# Patient Record
Sex: Female | Born: 1985 | Hispanic: No | Marital: Married | Smoking: Never smoker
Health system: Southern US, Community
[De-identification: ages and names within clinical notes are randomized; demographics above are authoritative.]

## PROBLEM LIST (undated history)

## (undated) DIAGNOSIS — M545 Low back pain: Secondary | ICD-10-CM

## (undated) DIAGNOSIS — D241 Benign neoplasm of right breast: Secondary | ICD-10-CM

## (undated) HISTORY — PX: DILATION AND EVACUATION: SHX1459

---

## 2011-01-23 ENCOUNTER — Ambulatory Visit: Payer: Self-pay | Admitting: Family Medicine

## 2011-01-27 ENCOUNTER — Encounter: Payer: Self-pay | Admitting: Family Medicine

## 2011-01-27 ENCOUNTER — Other Ambulatory Visit: Payer: Self-pay | Admitting: Family Medicine

## 2011-01-27 ENCOUNTER — Ambulatory Visit (INDEPENDENT_AMBULATORY_CARE_PROVIDER_SITE_OTHER): Payer: 59 | Admitting: Family Medicine

## 2011-01-27 VITALS — BP 97/71 | HR 68 | Ht 63.0 in | Wt 102.0 lb

## 2011-01-27 DIAGNOSIS — Z136 Encounter for screening for cardiovascular disorders: Secondary | ICD-10-CM

## 2011-01-27 DIAGNOSIS — R0789 Other chest pain: Secondary | ICD-10-CM

## 2011-01-27 NOTE — Progress Notes (Signed)
  Subjective:    Patient ID: Brittany Walker, female    DOB: 1986/03/17, 25 y.o.   MRN: [id number]  HPI Had some CP for about 15-20 minutes. HAs had several episodes, happened tiwce. One with activity and one in her sleep.  No GERD sxs. Symptoms mid-sternal. No family hx of heart problems.  HEart beats fast with the pain. Has been very stressed.  Having relationship problems. No other GI sxs or dysphagia.      Review of Systems  Constitutional: Negative for fever, diaphoresis and unexpected weight change.  HENT: Negative for hearing loss, rhinorrhea and tinnitus.   Eyes: Negative for visual disturbance.  Respiratory: Negative for cough and wheezing.   Cardiovascular: Negative for chest pain and palpitations.  Gastrointestinal: Negative for nausea, vomiting, diarrhea and blood in stool.  Genitourinary: Negative for vaginal bleeding, vaginal discharge and difficulty urinating.  Musculoskeletal: Negative for myalgias and arthralgias.  Skin: Negative for rash.  Neurological: Negative for headaches.  Hematological: Negative for adenopathy. Does not bruise/bleed easily.  Psychiatric/Behavioral: Negative for sleep disturbance and dysphoric mood. The patient is not nervous/anxious.    BP 97/71  Pulse 68  Ht 5\' 3"  (1.6 m)  Wt 102 lb (46.267 kg)  BMI 18.07 kg/m2    No Known Allergies  History reviewed. No pertinent past medical history.  History reviewed. No pertinent past surgical history.  History   Social History  . Marital Status: Single    Spouse Name: N/A    Number of Children: N/A  . Years of Education: college   Occupational History  . Accountant     [employer]    Social History Main Topics  . Smoking status: Never Smoker   . Smokeless tobacco: Not on file  . Alcohol Use: No  . Drug Use: No  . Sexually Active: Not on file   Other Topics Concern  . Not on file   Social History Narrative   Lives with parents.  Single.      Family History  Problem  Relation Age of Onset  . Hypertension Mother   . Hyperlipidemia Mother     No current outpatient prescriptions on file.     Objective:   Physical Exam  Constitutional: She is oriented to person, place, and time. She appears well-developed and well-nourished.  HENT:  Head: Normocephalic and atraumatic.  Nose: Nose normal.  Mouth/Throat: Oropharynx is clear and moist.  Eyes: Conjunctivae are normal. Pupils are equal, round, and reactive to light.  Cardiovascular: Normal rate, regular rhythm and normal heart sounds.   Pulmonary/Chest: Effort normal and breath sounds normal. She exhibits no tenderness.  Abdominal: Soft. Bowel sounds are normal. She exhibits no distension and no mass. There is no tenderness. There is no rebound and no guarding.  Neurological: She is alert and oriented to person, place, and time.  Skin: Skin is warm and dry.  Psychiatric: She has a normal mood and affect.          Assessment & Plan:  Chest Pain - Unlikely to be cardiac.  EKG shows rate of 61bpm. Short PR interval, ow NSR.   Consider GERD thought not really having any reflux sxs.  Discussed could be anxiety as well.   GAD-7 score of 12 (which is moderate).  Will check thyroid and rule out anemia as causes of her CP as well.

## 2011-01-28 ENCOUNTER — Telehealth: Payer: Self-pay | Admitting: Family Medicine

## 2011-01-28 LAB — CBC
Hemoglobin: 11.1 g/dL — ABNORMAL LOW (ref 12.0–15.0)
MCH: 28.2 pg (ref 26.0–34.0)
MCHC: 31.9 g/dL (ref 30.0–36.0)
Platelets: 202 10*3/uL (ref 150–400)
RBC: 3.94 MIL/uL (ref 3.87–5.11)

## 2011-01-28 LAB — COMPLETE METABOLIC PANEL WITH GFR
ALT: 9 U/L (ref 0–35)
Albumin: 4.7 g/dL (ref 3.5–5.2)
Alkaline Phosphatase: 41 U/L (ref 39–117)
CO2: 25 mEq/L (ref 19–32)
GFR, Est Non African American: >60 mL/min (ref >60)
Glucose, Bld: 82 mg/dL (ref 70–99)
Potassium: 4.8 mEq/L (ref 3.5–5.3)
Sodium: 138 mEq/L (ref 135–145)
Total Bilirubin: 0.3 mg/dL (ref 0.3–1.2)
Total Protein: 7.7 g/dL (ref 6.0–8.3)

## 2011-01-28 LAB — LIPID PANEL
HDL: 78 mg/dL (ref >39)
LDL Cholesterol: 96 mg/dL (ref 0–99)
Total CHOL/HDL Ratio: 2.3 Ratio

## 2011-01-28 LAB — IRON: Iron: 43 ug/dL (ref 42–145)

## 2011-01-28 LAB — VITAMIN B12: Vitamin B-12: 427 pg/mL (ref 211–911)

## 2011-01-28 LAB — TSH: TSH: 1.509 u[IU]/mL (ref 0.350–4.500)

## 2011-01-28 NOTE — Telephone Encounter (Signed)
Call pt: CBC, thyroid and cholesterol look great.  She is anemic. Call [facility] ad[facility] see if they can add iron and vitamin B12 labs.

## 2011-01-28 NOTE — Telephone Encounter (Signed)
Per Sherri at [facility], will add labs.Pt advised of results.

## 2011-01-28 NOTE — Telephone Encounter (Signed)
Call patient: Iron level is just barely into the normal range. I do recommend starting either an over-the-counter iron supplement or eating more iron rich foods. Vitamin B12 is normal. Then we can recheck her hemoglobin in 3 months.

## 2011-01-29 NOTE — Telephone Encounter (Signed)
LMOM informing Pt of the above 

## 2011-01-30 ENCOUNTER — Telehealth: Payer: Self-pay | Admitting: Family Medicine

## 2011-01-30 NOTE — Telephone Encounter (Signed)
Pt calls and states she had 2 more episodes of CP yest. Per Dr Linford Arnold her thyroid test was fine so she may try some OTC Prilosec 1 QD x 7-10 days. If this does not help pt to sched OV to discuss anxiety in more detail.

## 2011-02-07 ENCOUNTER — Encounter: Payer: Self-pay | Admitting: Family Medicine

## 2011-02-10 ENCOUNTER — Telehealth: Payer: Self-pay | Admitting: Family Medicine

## 2011-02-10 NOTE — Telephone Encounter (Signed)
Pt call and states she took prilosec x 7days and had 3 more episodes while on med. Pt sched OV for next wk when Dr Judie Petit is back in town.

## 2011-02-17 ENCOUNTER — Ambulatory Visit (INDEPENDENT_AMBULATORY_CARE_PROVIDER_SITE_OTHER): Payer: 59 | Admitting: Family Medicine

## 2011-02-17 ENCOUNTER — Encounter: Payer: Self-pay | Admitting: Family Medicine

## 2011-02-17 DIAGNOSIS — F411 Generalized anxiety disorder: Secondary | ICD-10-CM

## 2011-02-17 DIAGNOSIS — R002 Palpitations: Secondary | ICD-10-CM

## 2011-02-17 DIAGNOSIS — F419 Anxiety disorder, unspecified: Secondary | ICD-10-CM

## 2011-02-17 MED ORDER — ALPRAZOLAM 0.25 MG PO TABS: 0.2500 mg | ORAL_TABLET | Freq: Three times a day (TID) | ORAL | Status: AC | PRN

## 2011-02-17 NOTE — Progress Notes (Signed)
  Subjective:    Patient ID: Brittany Walker, female    DOB: 1985-10-25, 25 y.o.   MRN: [id number]  HPI  She is still having palpitations as needed.  Had 3 episodes over the weekened.  The longest episodes lasted about 30 minutes.  Has been working 10-12 hour shifts 3 days in a row.  She is not exercisign regularly. She is very stressed out at work and for personal reasons. No lightheadedness or dizziness. Typically the episodes last 10-15 minutes on average. She has a little bit of chest discomfort with it. She admits that she drinks a fair caffeine with several cups of coffee daily as well as black TE. She also eats chocolate frequently. She occasionally skips meals because her work is so busy. She is up on her feet a lot at work. She had a normal EKG at her last office visit. She is currently taking iron for anemia.  Lab Results  Component Value Date   TSH 1.509 01/27/2011   Lab Results  Component Value Date   WBC 6.0 01/27/2011   HGB 11.1* 01/27/2011   HCT 34.8* 01/27/2011   MCV 88.3 01/27/2011   PLT 202 01/27/2011     Review of Systems     Objective:   Physical Exam  Constitutional: She is oriented to person, place, and time. She appears well-developed and well-nourished.  HENT:  Head: Normocephalic and atraumatic.  Neck: Neck supple. No thyromegaly present.  Cardiovascular: Normal rate, regular rhythm and normal heart sounds.   Pulmonary/Chest: Effort normal and breath sounds normal.  Musculoskeletal: Normal range of motion. She exhibits no edema.  Lymphadenopathy:    She has no cervical adenopathy.  Neurological: She is alert and oriented to person, place, and time.  Skin: Skin is warm and dry.  Psychiatric: She has a normal mood and affect.          Assessment & Plan:  Palpitations-I do think that this is more stress and anxiety related and possibly exacerbated by her high caffeine and chocolate intake. We discussed him stopping caffeine and chocolate intake for 2 weeks  to see if her symptoms seem to improve her potentially resolve. I did have her complete a GAD-7 today. Seh still has moderate anxiety.    Anxiety-will discuss with son and her address this. I discussed the options and counseling, regular exercise, and possibly medication. We discussed the use of daily medications and when necessary medications. We will start with a low dose Xanax which she can start with a half a tablet if needed. I would like to see her back in one to 2 months to make sure she is doing well. She will contact me an update in about 2 weeks. She would like to start with regular exercise it off on any daily medication or counseling.

## 2011-02-17 NOTE — Patient Instructions (Signed)
Call me in 2 week if still having the heart racing.

## 2011-12-09 ENCOUNTER — Encounter: Payer: Self-pay | Admitting: Family Medicine

## 2011-12-09 ENCOUNTER — Ambulatory Visit (INDEPENDENT_AMBULATORY_CARE_PROVIDER_SITE_OTHER): Payer: 59 | Admitting: Family Medicine

## 2011-12-09 VITALS — BP 110/74 | HR 65 | Ht 63.0 in | Wt 104.0 lb

## 2011-12-09 DIAGNOSIS — F419 Anxiety disorder, unspecified: Secondary | ICD-10-CM

## 2011-12-09 DIAGNOSIS — Z13220 Encounter for screening for lipoid disorders: Secondary | ICD-10-CM

## 2011-12-09 DIAGNOSIS — Z8342 Family history of familial hypercholesterolemia: Secondary | ICD-10-CM

## 2011-12-09 DIAGNOSIS — E611 Iron deficiency: Secondary | ICD-10-CM

## 2011-12-09 DIAGNOSIS — F411 Generalized anxiety disorder: Secondary | ICD-10-CM

## 2011-12-09 DIAGNOSIS — Z8349 Family history of other endocrine, nutritional and metabolic diseases: Secondary | ICD-10-CM

## 2011-12-09 MED ORDER — DESOGESTREL-ETHINYL ESTRADIOL 0.15-0.02/0.01 MG (21/5) PO TABS: 1.0000 | ORAL_TABLET | Freq: Every day | ORAL | Status: DC

## 2011-12-09 NOTE — Patient Instructions (Signed)
We will call you with your lab results. If you don't here from us in about a week then please give us a call at [phone].  

## 2011-12-09 NOTE — Progress Notes (Signed)
  Subjective:    Patient ID: Brittany Walker, female    DOB: 15-Apr-1986, 26 y.o.   MRN: [id number]  HPI  F/U anemia from last year. She has been trying to eat iron rich foods. She has not tried to take an iron tablet over-the-counter because of possible hemorrhoids.  She is interested in birth control. She doesn't want to be on her period when she gets married in December.  She is not currently sexually active.  She has hemorrhoids.  Says they flare when she eats spicey foods.  No heartburn.  She wants to know presenting she can take before eating spicy foods that might prevent irritation of the hemorrhoids. She has noticed blood in her stool twice after eating spicy foods but she stays away from them this does not happen.  Anxiety has been well controlled. Her stress level is under control lately.    Review of Systems     Objective:   Physical Exam  Constitutional: She is oriented to person, place, and time. She appears well-developed and well-nourished.  HENT:  Head: Normocephalic and atraumatic.  Cardiovascular: Normal rate, regular rhythm and normal heart sounds.   Pulmonary/Chest: Effort normal and breath sounds normal.  Neurological: She is alert and oriented to person, place, and time.  Skin: Skin is warm and dry.  Psychiatric: She has a normal mood and affect. Her behavior is normal.          Assessment & Plan:  Anemia - check CBC. Continue her clonidine iron rich foods.  Anxiety-well controlled right now. We discussed having strategy implant as she gets closer to her wedding to help her de-stress and to keep her anxiety under control.  Contraceptive counseling-we discussed starting a birth control pill. We discussed the risks and benefits. We also discussed how to take the medication so that she can skip her period if needed closer to her wedding day. I encouraged her to start now so she has difficulty or problems we have time to change medication and it right before  arriving.  Hemorrhoids-I did not examine her today but we discussed ways to increase her fiber to pursue a more soft and certainly avoiding spicy foods if that seems to be a major trigger for her.  Because of her strong family history of high cholesterol, and she specifically requests to have her cholesterol rechecked today, even though was normal in August.

## 2011-12-10 LAB — CBC WITH DIFFERENTIAL/PLATELET
Basophils Absolute: 0 10*3/uL (ref 0.0–0.1)
Basophils Relative: 0 % (ref 0–1)
Eosinophils Absolute: 0.1 10*3/uL (ref 0.0–0.7)
Hemoglobin: 11.6 g/dL — ABNORMAL LOW (ref 12.0–15.0)
MCH: 28.7 pg (ref 26.0–34.0)
MCHC: 32.1 g/dL (ref 30.0–36.0)
Monocytes Relative: 6 % (ref 3–12)
Neutro Abs: 2.3 10*3/uL (ref 1.7–7.7)
Neutrophils Relative %: 45 % (ref 43–77)
Platelets: 244 10*3/uL (ref 150–400)
RDW: 14.3 % (ref 11.5–15.5)

## 2011-12-10 LAB — LIPID PANEL
HDL: 71 mg/dL (ref >39)
Triglycerides: 61 mg/dL (ref <150)

## 2011-12-10 LAB — COMPLETE METABOLIC PANEL WITH GFR
CO2: 28 mEq/L (ref 19–32)
Creat: 0.59 mg/dL (ref 0.50–1.10)
GFR, Est African American: >89 mL/min
GFR, Est Non African American: >89 mL/min
Glucose, Bld: 92 mg/dL (ref 70–99)
Total Bilirubin: 0.5 mg/dL (ref 0.3–1.2)
Total Protein: 7.4 g/dL (ref 6.0–8.3)

## 2012-10-13 ENCOUNTER — Ambulatory Visit: Payer: 59 | Admitting: Family Medicine

## 2013-02-15 ENCOUNTER — Encounter: Payer: Self-pay | Admitting: Family Medicine

## 2013-02-15 ENCOUNTER — Ambulatory Visit (INDEPENDENT_AMBULATORY_CARE_PROVIDER_SITE_OTHER): Payer: PRIVATE HEALTH INSURANCE | Admitting: Family Medicine

## 2013-02-15 VITALS — BP 101/72 | HR 68 | Wt 106.0 lb

## 2013-02-15 DIAGNOSIS — Z0000 Encounter for general adult medical examination without abnormal findings: Secondary | ICD-10-CM

## 2013-02-15 NOTE — Progress Notes (Signed)
  Subjective:     Brittany Walker is a 27 y.o. female and is here for a comprehensive physical exam. The patient reports problems - say the birth control made her gain weight. She has neve been sexually acttive so decline pap smear. Says gained 10 llbs on it.  No recent hearing or vision changes.  No breast pain or nodules.   History   Social History  . Marital Status: Single    Spouse Name: N/A    Number of Children: N/A  . Years of Education: college   Occupational History  . Accountant     Brittany Walker.    Social History Main Topics  . Smoking status: Never Smoker   . Smokeless tobacco: Not on file  . Alcohol Use: No  . Drug Use: No  . Sexually Active: Not on file   Other Topics Concern  . Not on file   Social History Narrative   Lives with parents.  Single.     Health Maintenance  Topic Date Due  . Pap Smear  06/06/2002  . Tetanus/tdap  06/07/2003  . Influenza Vaccine  04/18/2013    The following portions of the patient's history were reviewed and updated as appropriate: allergies, current medications, past family history, past medical history, past social history, past surgical history and problem list.  Review of Systems A comprehensive review of systems was negative.   Objective:    BP 101/72  Pulse 68  Wt 106 lb (48.081 kg)  BMI 18.78 kg/m2  LMP 01/19/2013 General appearance: alert, cooperative and appears stated age Head: Normocephalic, without obvious abnormality, atraumatic Eyes: conj clear, EOMi, PEELRA Ears: normal TM's and external ear canals both ears Nose: Nares normal. Septum midline. Mucosa normal. No drainage or sinus tenderness. Throat: lips, mucosa, and tongue normal; teeth and gums normal Neck: no adenopathy, no carotid bruit, no JVD, supple, symmetrical, trachea midline and thyroid not enlarged, symmetric, no tenderness/mass/nodules Back: symmetric, no curvature. ROM normal. No CVA tenderness. Lungs: clear to auscultation  bilaterally Breasts: normal appearance, no masses or tenderness Heart: regular rate and rhythm, S1, S2 normal, no murmur, click, rub or gallop Abdomen: soft, non-tender; bowel sounds normal; no masses,  no organomegaly Extremities: extremities normal, atraumatic, no cyanosis or edema Pulses: 2+ and symmetric Skin: Skin color, texture, turgor normal. No rashes or lesions Lymph nodes: Cervical, supraclavicular, and axillary nodes normal. Neurologic: Alert and oriented X 3, normal strength and tone. Normal symmetric reflexes. Normal coordination and gait    Assessment:    Healthy female exam.      Plan:     See After Visit Summary for Counseling Recommendations  Keep up a regular exercise program and make sure you are eating a healthy diet Try to eat 4 servings of dairy a day, or if you are lactose intolerant take a calcium with vitamin D daily.  Your vaccines are up to date.  Recommend pap next year  She just got married but hasn't been sexually active yet.  She declines OCP.

## 2013-02-15 NOTE — Patient Instructions (Addendum)
Keep up a regular exercise program and make sure you are eating a healthy diet Try to eat 4 servings of dairy a day, or if you are lactose intolerant take a calcium with vitamin D daily.  Your vaccines are up to date.   

## 2013-02-16 LAB — COMPLETE METABOLIC PANEL WITH GFR
CO2: 26 mEq/L (ref 19–32)
Calcium: 9.6 mg/dL (ref 8.4–10.5)
GFR, Est African American: >89 mL/min
GFR, Est Non African American: >89 mL/min
Glucose, Bld: 82 mg/dL (ref 70–99)
Sodium: 141 mEq/L (ref 135–145)
Total Bilirubin: 0.7 mg/dL (ref 0.3–1.2)
Total Protein: 7 g/dL (ref 6.0–8.3)

## 2013-02-16 LAB — CBC
HCT: 33.4 % — ABNORMAL LOW (ref 36.0–46.0)
Hemoglobin: 11.2 g/dL — ABNORMAL LOW (ref 12.0–15.0)
MCV: 81.9 fL (ref 78.0–100.0)
RBC: 4.08 MIL/uL (ref 3.87–5.11)
WBC: 4.4 10*3/uL (ref 4.0–10.5)

## 2013-02-16 LAB — LIPID PANEL: HDL: 67 mg/dL (ref >39)

## 2013-03-07 ENCOUNTER — Ambulatory Visit: Payer: PRIVATE HEALTH INSURANCE | Admitting: Family Medicine

## 2013-03-08 ENCOUNTER — Encounter: Payer: Self-pay | Admitting: Family Medicine

## 2013-03-08 ENCOUNTER — Ambulatory Visit (INDEPENDENT_AMBULATORY_CARE_PROVIDER_SITE_OTHER): Payer: PRIVATE HEALTH INSURANCE | Admitting: Family Medicine

## 2013-03-08 VITALS — BP 101/70 | HR 64 | Wt 109.0 lb

## 2013-03-08 DIAGNOSIS — N912 Amenorrhea, unspecified: Secondary | ICD-10-CM

## 2013-03-08 LAB — PROGESTERONE: Progesterone: 1.3 ng/mL

## 2013-03-08 LAB — FOLLICLE STIMULATING HORMONE: FSH: 7.9 m[IU]/mL

## 2013-03-08 MED ORDER — NORGESTIMATE-ETH ESTRADIOL 0.25-35 MG-MCG PO TABS: 1.0000 | ORAL_TABLET | Freq: Every day | ORAL | Status: DC

## 2013-03-08 NOTE — Progress Notes (Signed)
  Subjective:    Patient ID: Brittany Walker, female    DOB: [birth year], 27 y.o.   MRN: [id number]  HPI 2 weeks late for her period. She has been off OCP x 6 months. Not on any other medications.  She gained weight on pill so stopped it.  She had irregular periods before she started birth control.  She says the pill reset her cycyle.  No pelvic pain. No currently sexually active.  No discharge.     Review of Systems     Objective:   Physical Exam  Constitutional: She is oriented to person, place, and time. She appears well-developed and well-nourished.  HENT:  Head: Normocephalic and atraumatic.  Eyes: Conjunctivae are normal. Pupils are equal, round, and reactive to light.  Neck: Neck supple. No thyromegaly present.  Lymphadenopathy:    She has no cervical adenopathy.  Neurological: She is alert and oriented to person, place, and time.  Skin: Skin is warm and dry.  Psychiatric: She has a normal mood and affect. Her behavior is normal.          Assessment & Plan:  Amenorrhea. He test is negative. I suspect that she's just going back to her irregular cycle that she had before starting birth control. I suspect that the birth control actually reset her cycles for a short period of time and made her regular period she says she would rather be back on her birth control pill and had a regular been skipping around. This would discuss trying a different birth control.. Send prescription for Sprintec.interestingly she has gained 3 pounds in the last 20 days.

## 2013-03-08 NOTE — Patient Instructions (Signed)
° °  Iron-Rich Diet ° °An iron-rich diet contains foods that are good sources of iron. Iron is an important mineral that helps your body produce hemoglobin. Hemoglobin is a protein in red blood cells that carries oxygen to the body's tissues. Sometimes, the iron level in your blood can be low. This may be caused by: °· A lack of iron in your diet. °· Blood loss. °· Times of growth, such as during pregnancy or during a child's growth and development. °Low levels of iron can cause a decrease in the number of red blood cells. This can result in iron deficiency anemia. Iron deficiency anemia symptoms include: °· Tiredness. °· Weakness. °· Irritability. °· Increased chance of infection. °Here are some recommendations for daily iron intake: °· Males older than 29 years of age need 8 mg of iron per day. °· Women ages 19 to 50 need 18 mg of iron per day. °· Pregnant women need 27 mg of iron per day, and women who are over 19 years of age and breastfeeding need 9 mg of iron per day. °· Women over the age of 50 need 8 mg of iron per day. °SOURCES OF IRON °There are 2 types of iron that are found in food: heme iron and nonheme iron. Heme iron is absorbed by the body better than nonheme iron. Heme iron is found in meat, poultry, and fish. Nonheme iron is found in grains, beans, and vegetables. °Heme Iron Sources °Food / Iron (mg) °· Chicken liver, 3 oz (85 g)/ 10 mg °· Beef liver, 3 oz (85 g)/ 5.5 mg °· Oysters, 3 oz (85 g)/ 8 mg °· Beef, 3 oz (85 g)/ 2 to 3 mg °· Shrimp, 3 oz (85 g)/ 2.8 mg °· Turkey, 3 oz (85 g)/ 2 mg °· Chicken, 3 oz (85 g) / 1 mg °· Fish (tuna, halibut), 3 oz (85 g)/ 1 mg °· Pork, 3 oz (85 g)/ 0.9 mg °Nonheme Iron Sources °Food / Iron (mg) °· Ready-to-eat breakfast cereal, iron-fortified / 3.9 to 7 mg °· Tofu, ½ cup / 3.4 mg °· Kidney beans, ½ cup / 2.6 mg °· Baked potato with skin / 2.7 mg °· Asparagus, ½ cup / 2.2 mg °· Avocado / 2 mg °· Dried peaches, ½ cup / 1.6 mg °· Raisins, ½ cup / 1.5 mg °· Soy milk,  1 cup / 1.5 mg °· Whole-wheat bread, 1 slice / 1.2 mg °· Spinach, 1 cup / 0.8 mg °· Broccoli, ½ cup / 0.6 mg °IRON ABSORPTION °Certain foods can decrease the body's absorption of iron. Try to avoid these foods and beverages while eating meals with iron-containing foods: °· Coffee. °· Tea. °· Fiber. °· Soy. °Foods containing vitamin C can help increase the amount of iron your body absorbs from iron sources, especially from nonheme sources. Eat foods with vitamin C along with iron-containing foods to increase your iron absorption. Foods that are high in vitamin C include many fruits and vegetables. Some good sources are: °· Fresh orange juice. °· Oranges. °· Strawberries. °· Mangoes. °· Grapefruit. °· Red bell peppers. °· Green bell peppers. °· Broccoli. °· Potatoes with skin. °· Tomato juice. °Document Released: 03/18/2005 Document Revised: 10/27/2011 Document Reviewed: 01/23/2011 °[employer]® Patient Information ©2014 [employer]. ° °

## 2014-06-15 ENCOUNTER — Emergency Department
Admission: EM | Admit: 2014-06-15 | Discharge: 2014-06-15 | Disposition: A | Discharge status: Home / Self Care | Payer: PRIVATE HEALTH INSURANCE | Source: Home / Self Care | Attending: Emergency Medicine | Admitting: Emergency Medicine

## 2014-06-15 ENCOUNTER — Encounter: Payer: Self-pay | Admitting: Emergency Medicine

## 2014-06-15 DIAGNOSIS — R1031 Right lower quadrant pain: Secondary | ICD-10-CM

## 2014-06-15 DIAGNOSIS — R109 Unspecified abdominal pain: Secondary | ICD-10-CM

## 2014-06-15 LAB — POCT CBC W AUTO DIFF (K'VILLE URGENT CARE)

## 2014-06-15 LAB — POCT URINALYSIS DIP (MANUAL ENTRY)
Bilirubin, UA: NEGATIVE
Blood, UA: NEGATIVE
Glucose, UA: NEGATIVE
Ketones, POC UA: NEGATIVE
Nitrite, UA: NEGATIVE
Protein Ur, POC: NEGATIVE
Spec Grav, UA: 1.015 (ref 1.005–1.03)
Urobilinogen, UA: 0.2 (ref 0–1)
pH, UA: 5.5 (ref 5–8)

## 2014-06-15 LAB — POCT URINE PREGNANCY: Preg Test, Ur: NEGATIVE

## 2014-06-15 NOTE — ED Notes (Signed)
Lower right abdominal pain x 2 days, bowel movements normal

## 2014-06-15 NOTE — ED Provider Notes (Signed)
CSN: [id number]     Arrival date & time 06/15/14  2979 History   First MD Initiated Contact with Patient 06/15/14 [phone]     Chief Complaint  Patient presents with  . Abdominal Pain    HPI 28 year old female. PCP is Dr. Madilyn Fireman. Complains of one day of vague left mid lateral abdominal discomfort. Dull, feels like gas. No radiation. Not affected by meals or associated with urination or BM. She states she is worried about ruling out appendicitis or some other type of acute abdomen. Last normal menstrual period was 05/23/2014. Denies any GYN symptoms. Denies any voiding symptoms. No frequency or dysuria or hematuria. Denies nausea, vomiting, fever, chills, diarrhea or any change in bowel habits. Last BM yesterday was normal.  History reviewed. No pertinent past medical history. History reviewed. No pertinent past surgical history. Family History  Problem Relation Age of Onset  . Hypertension Mother   . Hyperlipidemia Mother    History  Substance Use Topics  . Smoking status: Never Smoker   . Smokeless tobacco: Not on file  . Alcohol Use: No   OB History   Grav Para Term Preterm Abortions TAB SAB Ect Mult Living                 Review of Systems  All other systems reviewed and are negative.   Allergies  Review of patient's allergies indicates not on file.  Home Medications   Prior to Admission medications   Medication Sig Start Date End Date Taking? Authorizing Provider  norgestimate-ethinyl estradiol (ORTHO-CYCLEN,SPRINTEC,PREVIFEM) 0.25-35 MG-MCG tablet Take 1 tablet by mouth daily. 03/08/13   Hali Marry, MD   BP 107/73  Pulse 74  Temp(Src) 98.3 F (36.8 C) (Oral)  Ht 5\' 3"  (1.6 m)  Wt 116 lb (52.617 kg)  BMI 20.55 kg/m2  SpO2 100%  LMP 05/23/2014 Physical Exam  Nursing note and vitals reviewed. Constitutional: She is oriented to person, place, and time. She appears well-developed and well-nourished. No distress.  Pleasant female, alert, cooperative. No  distress.  HENT:  Head: Normocephalic and atraumatic.  Mouth/Throat: Oropharynx is clear and moist.  Eyes: Conjunctivae and EOM are normal. Pupils are equal, round, and reactive to light. No scleral icterus.  Neck: Normal range of motion.  Cardiovascular: Normal rate.   Pulmonary/Chest: Effort normal and breath sounds normal.  Abdominal: Soft. Bowel sounds are normal. She exhibits no distension and no mass. There is no rebound and no guarding.  Mild tenderness left mid abdomen without any masses guarding or rebound. No hepatosplenomegaly.  Musculoskeletal: Normal range of motion. She exhibits no edema and no tenderness.  Neurological: She is alert and oriented to person, place, and time. No cranial nerve deficit.  Skin: Skin is warm. No rash noted. She is not diaphoretic.  Psychiatric: She has a normal mood and affect.    ED Course  Procedures (including critical care time) Labs Review Labs Reviewed  URINE CULTURE  POCT CBC W AUTO DIFF ([facility])  POCT URINALYSIS DIP (MANUAL ENTRY)  POCT URINE PREGNANCY     MDM  Left lateral abdominal pain--no evidence of acute abdomen  urinalysis within normal limits except small leukocytes. --Likely not significant. But will culture urine. Urine pregnancy test negative. CBC normal. WBC 7.1, hemoglobin 11.3.  MCV normal at 86 and other indices within normal limits.--She normally runs hemoglobin in the 11 range--I advised to continue to eat iron rich foods.  Over 25 minutes spent, greater than 50% of the time spent  for counseling and coordination of care.  She states she was particularly worried that she might have appendicitis. I reassured her that she has no sign of appendicitis or acute abdomen. The left-sided abdominal discomfort likely gas pain or muscular pain and I would expect it to resolve. She may try Gas-X and avoid foods that cause excess gas. We reviewed all the above labs, within normal limits. Questions invited and  answered.  Precautions discussed. Red flags discussed.--Emergency room if any red flags Otherwise, Follow up with PCP Patient voiced understanding and agreement.      Jacqulyn Cane, MD 06/15/14 [phone]

## 2014-06-16 ENCOUNTER — Telehealth: Payer: Self-pay | Admitting: Emergency Medicine

## 2014-06-16 NOTE — ED Notes (Signed)
Inquired about patient's status; encourage them to call with questions/concerns.  

## 2014-06-17 LAB — URINE CULTURE
Colony Count: NO GROWTH
Organism ID, Bacteria: NO GROWTH

## 2015-01-26 ENCOUNTER — Other Ambulatory Visit: Payer: Self-pay | Admitting: Family Medicine

## 2015-01-26 ENCOUNTER — Encounter: Payer: Self-pay | Admitting: Family Medicine

## 2015-01-26 ENCOUNTER — Other Ambulatory Visit (HOSPITAL_COMMUNITY)
Admission: RE | Admit: 2015-01-26 | Discharge: 2015-01-26 | Disposition: A | Discharge status: Home / Self Care | Payer: PRIVATE HEALTH INSURANCE | Source: Ambulatory Visit | Attending: Family Medicine | Admitting: Family Medicine

## 2015-01-26 ENCOUNTER — Ambulatory Visit (INDEPENDENT_AMBULATORY_CARE_PROVIDER_SITE_OTHER): Payer: PRIVATE HEALTH INSURANCE | Admitting: Family Medicine

## 2015-01-26 VITALS — BP 103/68 | HR 70 | Ht 63.0 in | Wt 115.0 lb

## 2015-01-26 DIAGNOSIS — Z1151 Encounter for screening for human papillomavirus (HPV): Secondary | ICD-10-CM | POA: Insufficient documentation

## 2015-01-26 DIAGNOSIS — Z0000 Encounter for general adult medical examination without abnormal findings: Secondary | ICD-10-CM | POA: Diagnosis not present

## 2015-01-26 DIAGNOSIS — R87810 Cervical high risk human papillomavirus (HPV) DNA test positive: Secondary | ICD-10-CM | POA: Insufficient documentation

## 2015-01-26 DIAGNOSIS — Z01419 Encounter for gynecological examination (general) (routine) without abnormal findings: Secondary | ICD-10-CM | POA: Insufficient documentation

## 2015-01-26 DIAGNOSIS — N6315 Unspecified lump in the right breast, overlapping quadrants: Secondary | ICD-10-CM

## 2015-01-26 DIAGNOSIS — Z124 Encounter for screening for malignant neoplasm of cervix: Secondary | ICD-10-CM | POA: Diagnosis not present

## 2015-01-26 DIAGNOSIS — N63 Unspecified lump in breast: Secondary | ICD-10-CM | POA: Diagnosis not present

## 2015-01-26 DIAGNOSIS — N6310 Unspecified lump in the right breast, unspecified quadrant: Secondary | ICD-10-CM

## 2015-01-26 DIAGNOSIS — Z3169 Encounter for other general counseling and advice on procreation: Secondary | ICD-10-CM | POA: Diagnosis not present

## 2015-01-26 LAB — CBC WITH DIFFERENTIAL/PLATELET
BASOS PCT: 0 % (ref 0–1)
Basophils Absolute: 0 10*3/uL (ref 0.0–0.1)
EOS ABS: 0.1 10*3/uL (ref 0.0–0.7)
EOS PCT: 1 % (ref 0–5)
HCT: 36.5 % (ref 36.0–46.0)
HEMOGLOBIN: 11.9 g/dL — AB (ref 12.0–15.0)
LYMPHS PCT: 39 % (ref 12–46)
Lymphs Abs: 2.7 10*3/uL (ref 0.7–4.0)
MCH: 27.8 pg (ref 26.0–34.0)
MCHC: 32.6 g/dL (ref 30.0–36.0)
MCV: 85.3 fL (ref 78.0–100.0)
MONOS PCT: 6 % (ref 3–12)
MPV: 10.9 fL (ref 8.6–12.4)
Monocytes Absolute: 0.4 10*3/uL (ref 0.1–1.0)
NEUTROS ABS: 3.7 10*3/uL (ref 1.7–7.7)
Neutrophils Relative %: 54 % (ref 43–77)
PLATELETS: 259 10*3/uL (ref 150–400)
RBC: 4.28 MIL/uL (ref 3.87–5.11)
RDW: 15 % (ref 11.5–15.5)
WBC: 6.8 10*3/uL (ref 4.0–10.5)

## 2015-01-26 LAB — COMPLETE METABOLIC PANEL WITH GFR
ALT: 10 U/L (ref 0–35)
AST: 19 U/L (ref 0–37)
Albumin: 4.5 g/dL (ref 3.5–5.2)
Alkaline Phosphatase: 49 U/L (ref 39–117)
BUN: 9 mg/dL (ref 6–23)
CALCIUM: 9.7 mg/dL (ref 8.4–10.5)
CHLORIDE: 103 meq/L (ref 96–112)
CO2: 24 mEq/L (ref 19–32)
Creat: 0.6 mg/dL (ref 0.50–1.10)
GFR, Est African American: >89 mL/min
GFR, Est Non African American: >89 mL/min
Glucose, Bld: 85 mg/dL (ref 70–99)
Potassium: 4.4 mEq/L (ref 3.5–5.3)
Sodium: 141 mEq/L (ref 135–145)
Total Bilirubin: 0.6 mg/dL (ref 0.2–1.2)
Total Protein: 7.6 g/dL (ref 6.0–8.3)

## 2015-01-26 LAB — LIPID PANEL
CHOL/HDL RATIO: 2.5 ratio
CHOLESTEROL: 177 mg/dL (ref 0–200)
HDL: 71 mg/dL (ref >=46)
LDL Cholesterol: 93 mg/dL (ref 0–99)
TRIGLYCERIDES: 63 mg/dL (ref <150)
VLDL: 13 mg/dL (ref 0–40)

## 2015-01-26 MED ORDER — PRENATAL VITAMINS 0.8 MG PO TABS: ORAL_TABLET | ORAL | Status: DC

## 2015-01-26 NOTE — Progress Notes (Signed)
Subjective:     Brittany Walker is a 29 y.o. female and is here for a comprehensive physical exam. The patient reports problems - She would like to discuss fertility. She came off of her birth control about a year ago and she her husband have been actively trying to get pregnant for about 8-9 months. She said it a couple of months for her. To regulate after coming off birth control. Her period is very regular every 28 days. It usually lasts for about 5. The first 3 days are more medium flow and then it seems to taper off. She does get breast tenderness before each. In fact lately she's had continued breast tenderness even after her cycle which is unusual for her.  History   Social History  . Marital Status: Single    Spouse Name: N/A  . Number of Children: N/A  . Years of Education: college   Occupational History  . Accountant     [employer]    Social History Main Topics  . Smoking status: Never Smoker   . Smokeless tobacco: Not on file  . Alcohol Use: No  . Drug Use: No  . Sexual Activity: Not on file   Other Topics Concern  . Not on file   Social History Narrative   Lives with parents.  Single.     Health Maintenance  Topic Date Due  . HIV Screening  06/07/1999  . PAP SMEAR  06/06/2002  . INFLUENZA VACCINE  03/19/2015  . TETANUS/TDAP  08/18/2020    The following portions of the patient's history were reviewed and updated as appropriate: allergies, current medications, past family history, past medical history, past social history, past surgical history and problem list.  Review of Systems A comprehensive review of systems was negative.   Objective:    BP 103/68 mmHg  Pulse 70  Ht 5\' 3"  (1.6 m)  Wt 115 lb (52.164 kg)  BMI 20.38 kg/m2  LMP 01/02/2015 General appearance: alert, cooperative and appears stated age Head: Normocephalic, without obvious abnormality, atraumatic Eyes: conj clear, EOMI, PEERLA Ears: normal TM's and external ear canals both  ears Nose: Nares normal. Septum midline. Mucosa normal. No drainage or sinus tenderness. Throat: lips, mucosa, and tongue normal; teeth and gums normal Neck: no adenopathy, no carotid bruit, no JVD, supple, symmetrical, trachea midline and thyroid not enlarged, symmetric, no tenderness/mass/nodules Back: symmetric, no curvature. ROM normal. No CVA tenderness. Lungs: clear to auscultation bilaterally Breasts: Inspection negative, No nipple retraction or dimpling, No nipple discharge or bleeding, No axillary or supraclavicular adenopathy, right breast lump at the 3 oclock position approx 2.4 x 2.5 cm irregular nodule Heart: regular rate and rhythm, S1, S2 normal, no murmur, click, rub or gallop Abdomen: soft, non-tender; bowel sounds normal; no masses,  no organomegaly Pelvic: cervix normal in appearance, external genitalia normal, no adnexal masses or tenderness, no cervical motion tenderness, rectovaginal septum normal, uterus normal size, shape, and consistency and vagina normal without discharge Extremities: extremities normal, atraumatic, no cyanosis or edema Pulses: 2+ and symmetric Skin: Skin color, texture, turgor normal. No rashes or lesions Lymph nodes: Cervical, supraclavicular, and axillary nodes normal. Neurologic: Alert and oriented X 3, normal strength and tone. Normal symmetric reflexes. Normal coordination and gait    Assessment:    Healthy female exam.     Plan:     See After Visit Summary for Counseling Recommendations   Infertility - Off the pill x 1 year.  She has been trying to  get pregnant for about 9 months. She feels like it is regular.  Exactly 28 days.  Medium bleeding.  Lasts 5 days.  LMP was 01/02/15.  Discussed checking her thyroid and progesterone level today since she has about a week before her next cycle. Discussed doing LH surge kits to help her get pregnant. Pap smear performed today. We will call with results once available. If she continues to try for a  year total and is unable to get pregnant and recommend that she see a GYN for further workup. She will need a hysterosalpingogram at that point that she most likely has normal hormone levels based on her regular cycles, and her husband will need a sperm analysis. We discussed this today. I did encourage her to go ahead and start a prenatal vitamin.  Breast lump - will order ultrasound. No suspicious for fibroadenoma but because of the size and firmness and irregularity I think we should work this up. She does report an aunt who had breast cancer.  breaat tenderness - make sure wearing good fitting bra.

## 2015-01-26 NOTE — Patient Instructions (Signed)
Recommend get an over-the-counter LH surge kit.

## 2015-01-27 LAB — PROGESTERONE: PROGESTERONE: 13.6 ng/mL

## 2015-01-27 LAB — TSH: TSH: 1.985 u[IU]/mL (ref 0.350–4.500)

## 2015-01-29 LAB — CYTOLOGY - PAP

## 2015-01-30 LAB — FERRITIN: Ferritin: 9 ng/mL — ABNORMAL LOW (ref 10–291)

## 2015-02-02 ENCOUNTER — Other Ambulatory Visit: Payer: Self-pay | Admitting: Family Medicine

## 2015-02-02 DIAGNOSIS — N6315 Unspecified lump in the right breast, overlapping quadrants: Secondary | ICD-10-CM

## 2015-02-02 DIAGNOSIS — N6310 Unspecified lump in the right breast, unspecified quadrant: Principal | ICD-10-CM

## 2015-02-07 ENCOUNTER — Ambulatory Visit
Admission: RE | Admit: 2015-02-07 | Discharge: 2015-02-07 | Disposition: A | Discharge status: Home / Self Care | Payer: PRIVATE HEALTH INSURANCE | Source: Ambulatory Visit | Attending: Family Medicine | Admitting: Family Medicine

## 2015-02-07 DIAGNOSIS — N6310 Unspecified lump in the right breast, unspecified quadrant: Principal | ICD-10-CM

## 2015-02-07 DIAGNOSIS — N6315 Unspecified lump in the right breast, overlapping quadrants: Secondary | ICD-10-CM

## 2015-03-02 ENCOUNTER — Encounter: Payer: PRIVATE HEALTH INSURANCE | Admitting: Advanced Practice Midwife

## 2015-09-06 ENCOUNTER — Other Ambulatory Visit: Payer: Self-pay | Admitting: Family Medicine

## 2015-09-06 DIAGNOSIS — N63 Unspecified lump in unspecified breast: Secondary | ICD-10-CM

## 2015-09-11 ENCOUNTER — Ambulatory Visit
Admission: RE | Admit: 2015-09-11 | Discharge: 2015-09-11 | Disposition: A | Discharge status: Home / Self Care | Payer: Managed Care, Other (non HMO) | Source: Ambulatory Visit | Attending: Family Medicine | Admitting: Family Medicine

## 2015-09-11 ENCOUNTER — Other Ambulatory Visit: Payer: Self-pay | Admitting: Family Medicine

## 2015-09-11 DIAGNOSIS — N6310 Unspecified lump in the right breast, unspecified quadrant: Secondary | ICD-10-CM

## 2015-09-11 DIAGNOSIS — N63 Unspecified lump in unspecified breast: Secondary | ICD-10-CM

## 2015-09-14 ENCOUNTER — Other Ambulatory Visit: Payer: Self-pay | Admitting: Family Medicine

## 2015-09-14 ENCOUNTER — Ambulatory Visit
Admission: RE | Admit: 2015-09-14 | Discharge: 2015-09-14 | Disposition: A | Discharge status: Home / Self Care | Payer: Managed Care, Other (non HMO) | Source: Ambulatory Visit | Attending: Family Medicine | Admitting: Family Medicine

## 2015-09-14 DIAGNOSIS — N6310 Unspecified lump in the right breast, unspecified quadrant: Secondary | ICD-10-CM

## 2015-10-12 ENCOUNTER — Ambulatory Visit: Payer: Self-pay | Admitting: Surgery

## 2015-10-12 NOTE — H&P (Signed)
Brittany Walker 10/12/2015 11:13 AM Location: [facility] Patient #: [id number] DOB: November 22, 1985 Married / Language: English / Race: Undefined Female  History of Present Illness (Brittany Walker A. Brittany Kostick MD; 10/12/2015 12:22 PM) Patient words: Patient sent at the request of Dr. Enriqueta Shutter for right breast mass. The patient has had a right breast mass in the medial portion of her right breast last year. It is gotten larger. She has been followed by [facility] [place] with serial ultrasounds. 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 It had increased in size over 6 months by 4-5 mm. It does cause mild to moderate discomfort and the patient states it is getting larger when she does her breast self examinations. She desires excision at this point due to the increase in size.                          CLINICAL DATA: Six-month follow-up of a probably benign right breast mass. EXAM: ULTRASOUND OF THE RIGHT BREAST COMPARISON: Previous exam(s). FINDINGS: On physical exam, a firm mobile palpable mass can be identified at the 3 o'clock location. Targeted ultrasound is performed, showing a hypoechoic mass measuring 2.9 x 2.6 x 1.4 cm, previously measuring 2.5 x 2.5 x 1.4 cm. The deep margin of the mass appears circumscribed, however other margins appear indistinct. IMPRESSION: The palpable mass in the right breast at 3 o'clock has slightly grown in size in the last 6 months. RECOMMENDATION: Ultrasound-guided biopsy is recommended for the palpable right breast mass. This has been scheduled for 09/14/2015 at 2 p.m. I have discussed the findings and recommendations with the patient. Results were also provided in writing at the conclusion of the visit. If applicable, a reminder letter will be sent to the patient regarding the next appointment. BI-RADS CATEGORY 4: Suspicious abnormality - biopsy should be considered. Electronically Signed By:  Ammie Ferrier M.D. On: 09/11/2015 17:12           Diagnosis Breast, right, needle core biopsy, 3:00 o'clock - FIBROADENOMA. - NO ATYPIA OR MALIGNANCY.  The patient is a 30 year old female.   Other Problems Brittany Walker, CMA; 10/12/2015 11:13 AM) No pertinent past medical history  Past Surgical History Brittany Walker, CMA; 10/12/2015 11:13 AM) Breast Biopsy Right.  Diagnostic Studies History Brittany Walker, CMA; 10/12/2015 11:13 AM) Colonoscopy never Mammogram within last year  Allergies Brittany Walker, CMA; 10/12/2015 11:14 AM) No Known Drug Allergies 10/12/2015  Medication History Brittany Walker, CMA; 10/12/2015 11:14 AM) No Current Medications Medications Reconciled  Social History Brittany Walker, CMA; 10/12/2015 11:13 AM) Alcohol use Occasional alcohol use. No drug use Tobacco use Never smoker.  Family History Brittany Walker, [place]; 10/12/2015 11:13 AM) First Degree Relatives No pertinent family history  Pregnancy / Birth History Brittany Walker, [place]; 10/12/2015 11:13 AM) Age at menarche 15 years. Gravida 1 Maternal age 42-30 Para 0 Regular periods     Review of Systems Brittany Walker CMA; 10/12/2015 11:13 AM) General Not Present- Appetite Loss, Chills, Fatigue, Fever, Night Sweats, Weight Gain and Weight Loss. Skin Not Present- Change in Wart/Mole, Dryness, Hives, Jaundice, New Lesions, Non-Healing Wounds, Rash and Ulcer. HEENT Not Present- Earache, Hearing Loss, Hoarseness, Nose Bleed, Oral Ulcers, Ringing in the Ears, Seasonal Allergies, Sinus Pain, Sore Throat, Visual Disturbances, Wears glasses/contact lenses and Yellow Eyes. Respiratory Not Present- Bloody sputum, Chronic Cough, Difficulty Breathing, Snoring and Wheezing. Breast Present- Breast Mass. Not Present- Breast Pain, Nipple Discharge and Skin Changes. Cardiovascular  Not Present- Chest Pain, Difficulty Breathing Lying Down, Leg Cramps, Palpitations, Rapid Heart Rate, Shortness of Breath and  Swelling of Extremities. Gastrointestinal Not Present- Abdominal Pain, Bloating, Bloody Stool, Change in Bowel Habits, Chronic diarrhea, Constipation, Difficulty Swallowing, Excessive gas, Gets full quickly at meals, Hemorrhoids, Indigestion, Nausea, Rectal Pain and Vomiting. Female Genitourinary Not Present- Frequency, Nocturia, Painful Urination, Pelvic Pain and Urgency. Neurological Not Present- Decreased Memory, Fainting, Headaches, Numbness, Seizures, Tingling, Tremor, Trouble walking and Weakness.  Vitals Brittany Walker CMA; 10/12/2015 11:14 AM) 10/12/2015 11:14 AM Weight: 120 lb Height: 63in Body Surface Area: 1.56 m Body Mass Index: 21.26 kg/m  Temp.: 97.27F(Temporal)  Pulse: 62 (Regular)  BP: 128/78 (Sitting, Left Arm, Standard)      Physical Exam ([name] [name] MD; 10/12/2015 12:23 PM)  Head and Neck Head-normocephalic, atraumatic with no lesions or palpable masses. Trachea-midline. Thyroid Gland Characteristics - normal size and consistency.  Chest and Lung Exam Chest and lung exam reveals -quiet, even and easy respiratory effort with no use of accessory muscles and on auscultation, normal breath sounds, no adventitious sounds and normal vocal resonance. Inspection Chest Wall - Normal. Back - normal.  Breast Note: Right breast medial 3:00 mobile rubbery 3 cm mass corresponding T ultrasound-guided core biopsy site. No other masses in either breast.  Cardiovascular Cardiovascular examination reveals -normal heart sounds, regular rate and rhythm with no murmurs and normal pedal pulses bilaterally.  Musculoskeletal Normal Exam - Left-Upper Extremity Strength Normal and Lower Extremity Strength Normal. Normal Exam - Right-Upper Extremity Strength Normal and Lower Extremity Strength Normal.  Lymphatic Head & Neck  General Head & Neck Lymphatics: Bilateral - Description - Normal. Axillary  General Axillary Region: Bilateral - Description -  Normal. Tenderness - Non Tender.    Assessment & Plan ([name] A. [name] MD; 10/12/2015 12:24 PM)  BREAST MASS, RIGHT (N63) Impression: Recommend excision of right breast mass since it is increasing in size. She is interested in having more children which will call us a fibroadenoma to grow rapidly. Location is in the medial right breast and if he gets much larger than it is now, removal all significant cosmetic deformity down the road. She desires excision of the right breast medial mass at this point. Given the size of the mass, she will not require see her wire localization since this is easily palpable Risk of lumpectomy include bleeding, infection, seroma, more surgery, use of seed/wire, wound care, cosmetic deformity and the need for other treatments, death , blood clots, death. Pt agrees to proceed.  Current Plans The anatomy and the physiology was discussed. The pathophysiology and natural history of the disease was discussed. Options were discussed and recommendations were made. Technique, risks, benefits, & alternatives were discussed. Risks such as stroke, heart attack, bleeding, indection, death, and other risks discussed. Questions answered. The patient agrees to proceed. You are being scheduled for surgery - Our schedulers will call you.  You should hear from our office's scheduling department within 5 working days about the location, date, and time of surgery. We try to make accommodations for patient's preferences in scheduling surgery, but sometimes the OR schedule or the surgeon's schedule prevents [place] from making those accommodations.  If you have not heard from our office [phone]) in 5 working days, call the office and ask for your surgeon's nurse.  If you have other questions about your diagnosis, plan, or surgery, call the office and ask for your surgeon's nurse.  Pt Education - [facility] Breast Biopsy [facility]: discussed with  patient and provided information.

## 2015-12-17 DIAGNOSIS — D241 Benign neoplasm of right breast: Secondary | ICD-10-CM

## 2015-12-17 HISTORY — DX: Benign neoplasm of right breast: D24.1

## 2015-12-18 ENCOUNTER — Encounter: Payer: Self-pay | Admitting: *Deleted

## 2015-12-18 ENCOUNTER — Emergency Department (INDEPENDENT_AMBULATORY_CARE_PROVIDER_SITE_OTHER)
Admission: EM | Admit: 2015-12-18 | Discharge: 2015-12-18 | Disposition: A | Discharge status: Home / Self Care | Payer: Managed Care, Other (non HMO) | Source: Home / Self Care | Attending: Family Medicine | Admitting: Family Medicine

## 2015-12-18 ENCOUNTER — Emergency Department (INDEPENDENT_AMBULATORY_CARE_PROVIDER_SITE_OTHER): Payer: Managed Care, Other (non HMO)

## 2015-12-18 DIAGNOSIS — S161XXA Strain of muscle, fascia and tendon at neck level, initial encounter: Secondary | ICD-10-CM

## 2015-12-18 DIAGNOSIS — S39012A Strain of muscle, fascia and tendon of lower back, initial encounter: Secondary | ICD-10-CM | POA: Diagnosis not present

## 2015-12-18 DIAGNOSIS — M419 Scoliosis, unspecified: Secondary | ICD-10-CM

## 2015-12-18 NOTE — Discharge Instructions (Signed)
Apply ice pack for 20 to 30 minutes, 3 to 4 times daily  Continue until pain decreases.  May take Ibuprofen 200mg , 4 tabs every 8 hours with food.  Wear soft cervical collar until pain decreases.  Begin range of motion and stretching exercises as tolerated.   Cervical Sprain A cervical sprain is an injury in the neck in which the strong, fibrous tissues (ligaments) that connect your neck bones stretch or tear. Cervical sprains can range from mild to severe. Severe cervical sprains can cause the neck vertebrae to be unstable. This can lead to damage of the spinal cord and can result in serious nervous system problems. The amount of time it takes for a cervical sprain to get better depends on the cause and extent of the injury. Most cervical sprains heal in 1 to 3 weeks. CAUSES  Severe cervical sprains may be caused by:   Contact sport injuries (such as from football, rugby, wrestling, hockey, auto racing, gymnastics, diving, martial arts, or boxing).   Motor vehicle collisions.   Whiplash injuries. This is an injury from a sudden forward and backward whipping movement of the head and neck.  Falls.  Mild cervical sprains may be caused by:   Being in an awkward position, such as while cradling a telephone between your ear and shoulder.   Sitting in a chair that does not offer proper support.   Working at a poorly Landscape architect station.   Looking up or down for long periods of time.  SYMPTOMS   Pain, soreness, stiffness, or a burning sensation in the front, back, or sides of the neck. This discomfort may develop immediately after the injury or slowly, 24 hours or more after the injury.   Pain or tenderness directly in the middle of the back of the neck.   Shoulder or upper back pain.   Limited ability to move the neck.   Headache.   Dizziness.   Weakness, numbness, or tingling in the hands or arms.   Muscle spasms.   Difficulty swallowing or chewing.    Tenderness and swelling of the neck.  DIAGNOSIS  Most of the time your health care provider can diagnose a cervical sprain by taking your history and doing a physical exam. Your health care provider will ask about previous neck injuries and any known neck problems, such as arthritis in the neck. X-rays may be taken to find out if there are any other problems, such as with the bones of the neck. Other tests, such as a CT scan or MRI, may also be needed.  TREATMENT  Treatment depends on the severity of the cervical sprain. Mild sprains can be treated with rest, keeping the neck in place (immobilization), and pain medicines. Severe cervical sprains are immediately immobilized. Further treatment is done to help with pain, muscle spasms, and other symptoms and may include:  Medicines, such as pain relievers, numbing medicines, or muscle relaxants.   Physical therapy. This may involve stretching exercises, strengthening exercises, and posture training. Exercises and improved posture can help stabilize the neck, strengthen muscles, and help stop symptoms from returning.  HOME CARE INSTRUCTIONS   Put ice on the injured area.   Put ice in a plastic bag.   Place a towel between your skin and the bag.   Leave the ice on for 15-20 minutes, 3-4 times a day.   If your injury was severe, you may have been given a cervical collar to wear. A cervical collar is a two-piece  collar designed to keep your neck from moving while it heals.  Do not remove the collar unless instructed by your health care provider.  If you have long hair, keep it outside of the collar.  Ask your health care provider before making any adjustments to your collar. Minor adjustments may be required over time to improve comfort and reduce pressure on your chin or on the back of your head.  Ifyou are allowed to remove the collar for cleaning or bathing, follow your health care provider's instructions on how to do so  safely.  Keep your collar clean by wiping it with mild soap and water and drying it completely. If the collar you have been given includes removable pads, remove them every 1-2 days and hand wash them with soap and water. Allow them to air dry. They should be completely dry before you wear them in the collar.  If you are allowed to remove the collar for cleaning and bathing, wash and dry the skin of your neck. Check your skin for irritation or sores. If you see any, tell your health care provider.  Do not drive while wearing the collar.   Only take over-the-counter or prescription medicines for pain, discomfort, or fever as directed by your health care provider.   Keep all follow-up appointments as directed by your health care provider.   Keep all physical therapy appointments as directed by your health care provider.   Make any needed adjustments to your workstation to promote good posture.   Avoid positions and activities that make your symptoms worse.   Warm up and stretch before being active to help prevent problems.  SEEK MEDICAL CARE IF:   Your pain is not controlled with medicine.   You are unable to decrease your pain medicine over time as planned.   Your activity level is not improving as expected.  SEEK IMMEDIATE MEDICAL CARE IF:   You develop any bleeding.  You develop stomach upset.  You have signs of an allergic reaction to your medicine.   Your symptoms get worse.   You develop new, unexplained symptoms.   You have numbness, tingling, weakness, or paralysis in any part of your body.  MAKE SURE YOU:   Understand these instructions.  Will watch your condition.  Will get help right away if you are not doing well or get worse.   This information is not intended to replace advice given to you by your health care provider. Make sure you discuss any questions you have with your health care provider.   Document Released: 06/01/2007 Document  Revised: 08/09/2013 Document Reviewed: 02/09/2013 Elsevier Interactive Patient Education 2016 [employer]   Lumbosacral Strain Lumbosacral strain is a strain of any of the parts that make up your lumbosacral vertebrae. Your lumbosacral vertebrae are the bones that make up the lower third of your backbone. Your lumbosacral vertebrae are held together by muscles and tough, fibrous tissue (ligaments).  CAUSES  A sudden blow to your back can cause lumbosacral strain. Also, anything that causes an excessive stretch of the muscles in the low back can cause this strain. This is typically seen when people exert themselves strenuously, fall, lift heavy objects, bend, or crouch repeatedly. RISK FACTORS  Physically demanding work.  Participation in pushing or pulling sports or sports that require a sudden twist of the back (tennis, golf, baseball).  Weight lifting.  Excessive lower back curvature.  Forward-tilted pelvis.  Weak back or abdominal muscles or both.  Tight hamstrings.  SIGNS AND SYMPTOMS  Lumbosacral strain may cause pain in the area of your injury or pain that moves (radiates) down your leg.  DIAGNOSIS Your health care provider can often diagnose lumbosacral strain through a physical exam. In some cases, you may need tests such as X-ray exams.  TREATMENT  Treatment for your lower back injury depends on many factors that your clinician will have to evaluate. However, most treatment will include the use of anti-inflammatory medicines. HOME CARE INSTRUCTIONS   Avoid hard physical activities (tennis, racquetball, waterskiing) if you are not in proper physical condition for it. This may aggravate or create problems.  If you have a back problem, avoid sports requiring sudden body movements. Swimming and walking are generally safer activities.  Maintain good posture.  Maintain a healthy weight.  For acute conditions, you may put ice on the injured area.  Put ice in a plastic  bag.  Place a towel between your skin and the bag.  Leave the ice on for 20 minutes, 2-3 times a day.  When the low back starts healing, stretching and strengthening exercises may be recommended. SEEK MEDICAL CARE IF:  Your back pain is getting worse.  You experience severe back pain not relieved with medicines. SEEK IMMEDIATE MEDICAL CARE IF:   You have numbness, tingling, weakness, or problems with the use of your arms or legs.  There is a change in bowel or bladder control.  You have increasing pain in any area of the body, including your belly (abdomen).  You notice shortness of breath, dizziness, or feel faint.  You feel sick to your stomach (nauseous), are throwing up (vomiting), or become sweaty.  You notice discoloration of your toes or legs, or your feet get very cold. MAKE SURE YOU:   Understand these instructions.  Will watch your condition.   Motor Vehicle Collision It is common to have multiple bruises and sore muscles after a motor vehicle collision (MVC). These tend to feel worse for the first 24 hours. You may have the most stiffness and soreness over the first several hours. You may also feel worse when you wake up the first morning after your collision. After this point, you will usually begin to improve with each day. The speed of improvement often depends on the severity of the collision, the number of injuries, and the location and nature of these injuries. HOME CARE INSTRUCTIONS  Put ice on the injured area.  Put ice in a plastic bag.  Place a towel between your skin and the bag.  Leave the ice on for 15-20 minutes, 3-4 times a day, or as directed by your health care provider.  Drink enough fluids to keep your urine clear or pale yellow. Do not drink alcohol.  Take a warm shower or bath once or twice a day. This will increase blood flow to sore muscles.  You may return to activities as directed by your caregiver. Be careful when lifting, as this  may aggravate neck or back pain.  Only take over-the-counter or prescription medicines for pain, discomfort, or fever as directed by your caregiver. Do not use aspirin. This may increase bruising and bleeding. SEEK IMMEDIATE MEDICAL CARE IF:  You have numbness, tingling, or weakness in the arms or legs.  You develop severe headaches not relieved with medicine.  You have severe neck pain, especially tenderness in the middle of the back of your neck.  You have changes in bowel or bladder control.  There is increasing pain in  any area of the body.  You have shortness of breath, light-headedness, dizziness, or fainting.  You have chest pain.  You feel sick to your stomach (nauseous), throw up (vomit), or sweat.  You have increasing abdominal discomfort.  There is blood in your urine, stool, or vomit.  You have pain in your shoulder (shoulder strap areas).  You feel your symptoms are getting worse. MAKE SURE YOU:  Understand these instructions.  Will watch your condition.  Will get help right away if you are not doing well or get worse.   This information is not intended to replace advice given to you by your health care provider. Make sure you discuss any questions you have with your health care provider.   Document Released: 08/04/2005 Document Revised: 08/25/2014 Document Reviewed: 01/01/2011 Elsevier Interactive Patient Education [employer].   Will get help right away if you are not doing well or get worse.   This information is not intended to replace advice given to you by your health care provider. Make sure you discuss any questions you have with your health care provider.   Document Released: 05/14/2005 Document Revised: 08/25/2014 Document Reviewed: 03/23/2013 Elsevier Interactive Patient Education [employer].
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
## 2015-12-20 ENCOUNTER — Encounter (HOSPITAL_BASED_OUTPATIENT_CLINIC_OR_DEPARTMENT_OTHER): Payer: Self-pay | Admitting: *Deleted

## 2015-12-20 DIAGNOSIS — M545 Low back pain, unspecified: Secondary | ICD-10-CM

## 2015-12-20 HISTORY — DX: Low back pain, unspecified: M54.50

## 2015-12-26 ENCOUNTER — Encounter (HOSPITAL_BASED_OUTPATIENT_CLINIC_OR_DEPARTMENT_OTHER): Payer: Self-pay | Admitting: Anesthesiology

## 2015-12-26 ENCOUNTER — Ambulatory Visit (HOSPITAL_BASED_OUTPATIENT_CLINIC_OR_DEPARTMENT_OTHER): Payer: Managed Care, Other (non HMO) | Admitting: Anesthesiology

## 2015-12-26 ENCOUNTER — Encounter (HOSPITAL_BASED_OUTPATIENT_CLINIC_OR_DEPARTMENT_OTHER)
Admission: RE | Disposition: A | Discharge status: Home / Self Care | Payer: Self-pay | Source: Ambulatory Visit | Attending: Surgery

## 2015-12-26 ENCOUNTER — Ambulatory Visit (HOSPITAL_BASED_OUTPATIENT_CLINIC_OR_DEPARTMENT_OTHER)
Admission: RE | Admit: 2015-12-26 | Discharge: 2015-12-26 | Disposition: A | Discharge status: Home / Self Care | Payer: Managed Care, Other (non HMO) | Source: Ambulatory Visit | Attending: Surgery | Admitting: Surgery

## 2015-12-26 DIAGNOSIS — N63 Unspecified lump in breast: Secondary | ICD-10-CM | POA: Diagnosis present

## 2015-12-26 DIAGNOSIS — D241 Benign neoplasm of right breast: Secondary | ICD-10-CM | POA: Insufficient documentation

## 2015-12-26 HISTORY — DX: Low back pain: M54.5

## 2015-12-26 HISTORY — PX: BREAST LUMPECTOMY: SHX2

## 2015-12-26 HISTORY — DX: Benign neoplasm of right breast: D24.1

## 2015-12-26 LAB — HCG, SERUM, QUALITATIVE: PREG SERUM: NEGATIVE

## 2015-12-26 SURGERY — BREAST LUMPECTOMY
Anesthesia: General | Site: Breast | Laterality: Right

## 2015-12-26 MED ORDER — PROPOFOL 10 MG/ML IV BOLUS
INTRAVENOUS | Status: AC
  Filled 2015-12-26: qty 20

## 2015-12-26 MED ORDER — DEXAMETHASONE SODIUM PHOSPHATE 10 MG/ML IJ SOLN
INTRAMUSCULAR | Status: AC
  Filled 2015-12-26: qty 1

## 2015-12-26 MED ORDER — CEFAZOLIN SODIUM-DEXTROSE 2-4 GM/100ML-% IV SOLN
INTRAVENOUS | Status: AC
  Filled 2015-12-26: qty 100

## 2015-12-26 MED ORDER — LACTATED RINGERS IV SOLN
INTRAVENOUS | Status: DC
  Administered 2015-12-26 (×2): via INTRAVENOUS

## 2015-12-26 MED ORDER — ONDANSETRON HCL 4 MG/2ML IJ SOLN
INTRAMUSCULAR | Status: DC | PRN
  Administered 2015-12-26: 4 mg via INTRAVENOUS

## 2015-12-26 MED ORDER — DEXAMETHASONE SODIUM PHOSPHATE 4 MG/ML IJ SOLN
INTRAMUSCULAR | Status: DC | PRN
  Administered 2015-12-26: 10 mg via INTRAVENOUS

## 2015-12-26 MED ORDER — MIDAZOLAM HCL 2 MG/2ML IJ SOLN
INTRAMUSCULAR | Status: AC
  Filled 2015-12-26: qty 2

## 2015-12-26 MED ORDER — HYDROMORPHONE HCL 1 MG/ML IJ SOLN: 0.2500 mg | INTRAMUSCULAR | Status: DC | PRN

## 2015-12-26 MED ORDER — FENTANYL CITRATE (PF) 100 MCG/2ML IJ SOLN
50.0000 ug | INTRAMUSCULAR | Status: DC | PRN
  Administered 2015-12-26: 100 ug via INTRAVENOUS

## 2015-12-26 MED ORDER — GLYCOPYRROLATE 0.2 MG/ML IJ SOLN: 0.2000 mg | Freq: Once | INTRAMUSCULAR | Status: DC | PRN

## 2015-12-26 MED ORDER — SCOPOLAMINE 1 MG/3DAYS TD PT72: 1.0000 | MEDICATED_PATCH | Freq: Once | TRANSDERMAL | Status: DC | PRN

## 2015-12-26 MED ORDER — BUPIVACAINE-EPINEPHRINE 0.25% -1:200000 IJ SOLN
INTRAMUSCULAR | Status: DC | PRN
  Administered 2015-12-26: 6 mL

## 2015-12-26 MED ORDER — MEPERIDINE HCL 25 MG/ML IJ SOLN: 6.2500 mg | INTRAMUSCULAR | Status: DC | PRN

## 2015-12-26 MED ORDER — FENTANYL CITRATE (PF) 100 MCG/2ML IJ SOLN
INTRAMUSCULAR | Status: AC
  Filled 2015-12-26: qty 2

## 2015-12-26 MED ORDER — LIDOCAINE HCL (CARDIAC) 20 MG/ML IV SOLN
INTRAVENOUS | Status: DC | PRN
  Administered 2015-12-26: 60 mg via INTRAVENOUS

## 2015-12-26 MED ORDER — DEXTROSE 5 % IV SOLN
3.0000 g | INTRAVENOUS | Status: AC
  Administered 2015-12-26: 2 g via INTRAVENOUS

## 2015-12-26 MED ORDER — ONDANSETRON HCL 4 MG/2ML IJ SOLN
INTRAMUSCULAR | Status: AC
  Filled 2015-12-26: qty 2

## 2015-12-26 MED ORDER — MIDAZOLAM HCL 2 MG/2ML IJ SOLN
1.0000 mg | INTRAMUSCULAR | Status: DC | PRN
Start: 2015-12-26 — End: 2015-12-26
  Administered 2015-12-26: 2 mg via INTRAVENOUS

## 2015-12-26 MED ORDER — OXYCODONE HCL 5 MG/5ML PO SOLN: 5.0000 mg | Freq: Once | ORAL | Status: DC | PRN

## 2015-12-26 MED ORDER — LIDOCAINE 2% (20 MG/ML) 5 ML SYRINGE
INTRAMUSCULAR | Status: AC
  Filled 2015-12-26: qty 5

## 2015-12-26 MED ORDER — TRAMADOL HCL 50 MG PO TABS: 50.0000 mg | ORAL_TABLET | Freq: Four times a day (QID) | ORAL | Status: DC | PRN

## 2015-12-26 MED ORDER — OXYCODONE HCL 5 MG PO TABS: 5.0000 mg | ORAL_TABLET | Freq: Once | ORAL | Status: DC | PRN

## 2015-12-26 MED ORDER — PROMETHAZINE HCL 25 MG/ML IJ SOLN
6.2500 mg | INTRAMUSCULAR | Status: DC | PRN
Start: 2015-12-26 — End: 2015-12-26

## 2015-12-26 MED ORDER — PROPOFOL 10 MG/ML IV BOLUS
INTRAVENOUS | Status: DC | PRN
  Administered 2015-12-26: 150 mg via INTRAVENOUS

## 2015-12-26 SURGICAL SUPPLY — 37 items
BLADE SURG 15 STRL LF DISP TIS (BLADE) ×1 IMPLANT
BLADE SURG 15 STRL SS (BLADE) ×2
CANISTER SUCT 1200ML W/VALVE (MISCELLANEOUS) ×3 IMPLANT
CHLORAPREP W/TINT 26ML (MISCELLANEOUS) ×3 IMPLANT
COVER BACK TABLE 60X90IN (DRAPES) ×3 IMPLANT
COVER MAYO STAND STRL (DRAPES) ×3 IMPLANT
DRAPE LAPAROTOMY 100X72 PEDS (DRAPES) ×3 IMPLANT
DRAPE UTILITY XL STRL (DRAPES) ×3 IMPLANT
ELECT COATED BLADE 2.86 ST (ELECTRODE) ×3 IMPLANT
ELECT REM PT RETURN 9FT ADLT (ELECTROSURGICAL) ×3
ELECTRODE REM PT RTRN 9FT ADLT (ELECTROSURGICAL) ×1 IMPLANT
GLOVE BIO SURGEON STRL SZ 6.5 (GLOVE) ×2 IMPLANT
GLOVE BIO SURGEONS STRL SZ 6.5 (GLOVE) ×1
GLOVE BIOGEL M STRL SZ7.5 (GLOVE) ×3 IMPLANT
GLOVE BIOGEL PI IND STRL 7.0 (GLOVE) ×2 IMPLANT
GLOVE BIOGEL PI IND STRL 8 (GLOVE) ×2 IMPLANT
GLOVE BIOGEL PI INDICATOR 7.0 (GLOVE) ×4
GLOVE BIOGEL PI INDICATOR 8 (GLOVE) ×4
GLOVE ECLIPSE 8.0 STRL XLNG CF (GLOVE) ×3 IMPLANT
GOWN STRL REUS W/ TWL LRG LVL3 (GOWN DISPOSABLE) ×2 IMPLANT
GOWN STRL REUS W/ TWL XL LVL3 (GOWN DISPOSABLE) ×1 IMPLANT
GOWN STRL REUS W/TWL LRG LVL3 (GOWN DISPOSABLE) ×4
GOWN STRL REUS W/TWL XL LVL3 (GOWN DISPOSABLE) ×2
KIT MARKER MARGIN INK (KITS) ×3 IMPLANT
LIQUID BAND (GAUZE/BANDAGES/DRESSINGS) ×3 IMPLANT
NEEDLE HYPO 25X1 1.5 SAFETY (NEEDLE) ×3 IMPLANT
PACK BASIN DAY SURGERY FS (CUSTOM PROCEDURE TRAY) ×3 IMPLANT
PENCIL BUTTON HOLSTER BLD 10FT (ELECTRODE) ×3 IMPLANT
SLEEVE SCD COMPRESS KNEE MED (MISCELLANEOUS) ×3 IMPLANT
SPONGE LAP 4X18 X RAY DECT (DISPOSABLE) ×3 IMPLANT
SUT MON AB 4-0 PC3 18 (SUTURE) ×3 IMPLANT
SUT VICRYL 3-0 CR8 SH (SUTURE) ×3 IMPLANT
SYR CONTROL 10ML LL (SYRINGE) ×3 IMPLANT
TOWEL OR 17X24 6PK STRL BLUE (TOWEL DISPOSABLE) ×6 IMPLANT
TUBE CONNECTING 20'X1/4 (TUBING) ×1
TUBE CONNECTING 20X1/4 (TUBING) ×2 IMPLANT
YANKAUER SUCT BULB TIP NO VENT (SUCTIONS) ×3 IMPLANT

## 2015-12-26 NOTE — Transfer of Care (Signed)
Immediate Anesthesia Transfer of Care Note  Patient: Brittany Walker  Procedure(s) Performed: Procedure(s): RIGHT BREAST LUMPECTOMY (Right)  Patient Location: PACU  Anesthesia Type:General  Level of Consciousness: sedated  Airway & Oxygen Therapy: Patient Spontanous Breathing and Patient connected to face mask oxygen  Post-op Assessment: Report given to RN and Post -op Vital signs reviewed and stable  Post vital signs: Reviewed and stable  Last Vitals:  Filed Vitals:   12/26/15 1245  BP: 104/72  Pulse: 69  Temp: 37.1 C  Resp: 16    Last Pain:  Filed Vitals:   12/26/15 1247  PainSc: 3          Complications: No apparent anesthesia complications

## 2015-12-26 NOTE — Anesthesia Procedure Notes (Signed)
Procedure Name: LMA Insertion Date/Time: 12/26/2015 2:58 PM Performed by: Maryella Shivers Pre-anesthesia Checklist: Patient identified, Emergency Drugs available, Suction available and Patient being monitored Patient Re-evaluated:Patient Re-evaluated prior to inductionOxygen Delivery Method: Circle System Utilized Preoxygenation: Pre-oxygenation with 100% oxygen Intubation Type: IV induction Ventilation: Mask ventilation without difficulty LMA: LMA inserted LMA Size: 4.0 Number of attempts: 1 Airway Equipment and Method: Bite block Placement Confirmation: positive ETCO2 Tube secured with: Tape Dental Injury: Teeth and Oropharynx as per pre-operative assessment

## 2015-12-26 NOTE — Anesthesia Postprocedure Evaluation (Signed)
Anesthesia Post Note  Patient: Brittany Walker  Procedure(s) Performed: Procedure(s) (LRB): RIGHT BREAST LUMPECTOMY (Right)  Patient location during evaluation: PACU Anesthesia Type: General Level of consciousness: sedated and patient cooperative Pain management: pain level controlled Vital Signs Assessment: post-procedure vital signs reviewed and stable Respiratory status: spontaneous breathing Cardiovascular status: stable Anesthetic complications: no    Last Vitals:  Filed Vitals:   12/26/15 1600 12/26/15 1619  BP: 113/85 102/66  Pulse: 77 71  Temp:  36.4 C  Resp: 17 18    Last Pain:  Filed Vitals:   12/26/15 1620  PainSc: 0-No pain                 Nolon Nations

## 2015-12-26 NOTE — Anesthesia Preprocedure Evaluation (Signed)
Anesthesia Evaluation  Patient identified by MRN, date of birth, ID band Patient awake    Reviewed: Allergy & Precautions, NPO status , Patient's Chart, lab work & pertinent test results  Airway Mallampati: II  TM Distance: >3 FB Neck ROM: Full    Dental no notable dental hx.    Pulmonary neg pulmonary ROS,    Pulmonary exam normal breath sounds clear to auscultation       Cardiovascular negative cardio ROS Normal cardiovascular exam Rhythm:Regular Rate:Normal     Neuro/Psych negative neurological ROS  negative psych ROS   GI/Hepatic negative GI ROS, Neg liver ROS,   Endo/Other  negative endocrine ROS  Renal/GU negative Renal ROS     Musculoskeletal negative musculoskeletal ROS (+)   Abdominal   Peds  Hematology negative hematology ROS (+)   Anesthesia Other Findings   Reproductive/Obstetrics negative OB ROS                             Anesthesia Physical Anesthesia Plan  ASA: I  Anesthesia Plan: General   Post-op Pain Management:    Induction: Intravenous  Airway Management Planned: LMA  Additional Equipment:   Intra-op Plan:   Post-operative Plan: Extubation in OR  Informed Consent: I have reviewed the patients History and Physical, chart, labs and discussed the procedure including the risks, benefits and alternatives for the proposed anesthesia with the patient or authorized representative who has indicated his/her understanding and acceptance.   Dental advisory given  Plan Discussed with: CRNA  Anesthesia Plan Comments:         Anesthesia Quick Evaluation

## 2015-12-26 NOTE — Brief Op Note (Signed)
12/26/2015  3:21 PM  PATIENT:  Mikel Cella  30 y.o. female  PRE-OPERATIVE DIAGNOSIS:  Right fibroadenoma   POST-OPERATIVE DIAGNOSIS:  Right fibroadenoma   PROCEDURE:  Procedure(s): RIGHT BREAST LUMPECTOMY (Right)  SURGEON:  Surgeon(s) and Role:    * Erroll Luna, MD - Primary     ANESTHESIA:   local and general  EBL:  minimal  BLOOD ADMINISTERED:none  DRAINS: none   LOCAL MEDICATIONS USED:  BUPIVICAINE   SPECIMEN:  Source of Specimen:  right breast   DISPOSITION OF SPECIMEN:  PATHOLOGY  COUNTS:  YES  TOURNIQUET:  * No tourniquets in log *  DICTATION: .Other Dictation: Dictation Number [id number]  PLAN OF CARE: Discharge to home after PACU  PATIENT DISPOSITION:  PACU - hemodynamically stable.   Delay start of Pharmacological VTE agent (>24hrs) due to surgical blood loss or risk of bleeding: not applicable

## 2015-12-26 NOTE — Discharge Instructions (Signed)
[facility] °Office Phone Number [phone] ° °BREAST BIOPSY/ PARTIAL MASTECTOMY: POST OP INSTRUCTIONS ° °Always review your discharge instruction sheet given to you by the facility where your surgery was performed. ° °IF YOU HAVE DISABILITY OR FAMILY LEAVE FORMS, YOU MUST BRING THEM TO THE OFFICE FOR PROCESSING.  DO NOT GIVE THEM TO YOUR DOCTOR. ° °1. A prescription for pain medication may be given to you upon discharge.  Take your pain medication as prescribed, if needed.  If narcotic pain medicine is not needed, then you may take acetaminophen (Tylenol) or ibuprofen (Advil) as needed. °2. Take your usually prescribed medications unless otherwise directed °3. If you need a refill on your pain medication, please contact your pharmacy.  They will contact our office to request authorization.  Prescriptions will not be filled after 5pm or on week-ends. °4. You should eat very light the first 24 hours after surgery, such as soup, crackers, pudding, etc.  Resume your normal diet the day after surgery. °5. Most patients will experience some swelling and bruising in the breast.  Ice packs and a good support bra will help.  Swelling and bruising can take several days to resolve.  °6. It is common to experience some constipation if taking pain medication after surgery.  Increasing fluid intake and taking a stool softener will usually help or prevent this problem from occurring.  A mild laxative (Milk of Magnesia or Miralax) should be taken according to package directions if there are no bowel movements after 48 hours. °7. Unless discharge instructions indicate otherwise, you may remove your bandages 24-48 hours after surgery, and you may shower at that time.  You may have steri-strips (small skin tapes) in place directly over the incision.  These strips should be left on the skin for 7-10 days.  If your surgeon used skin glue on the incision, you may shower in 24 hours.  The glue will flake off over the  next 2-3 weeks.  Any sutures or staples will be removed at the office during your follow-up visit. °8. ACTIVITIES:  You may resume regular daily activities (gradually increasing) beginning the next day.  Wearing a good support bra or sports bra minimizes pain and swelling.  You may have sexual intercourse when it is comfortable. °a. You may drive when you no longer are taking prescription pain medication, you can comfortably wear a seatbelt, and you can safely maneuver your car and apply brakes. °b. RETURN TO WORK:  ______________________________________________________________________________________ °9. You should see your doctor in the office for a follow-up appointment approximately two weeks after your surgery.  Your doctor’s nurse will typically make your follow-up appointment when she calls you with your pathology report.  Expect your pathology report 2-3 business days after your surgery.  You may call to check if you do not hear from us after three days. °10. OTHER INSTRUCTIONS: _______________________________________________________________________________________________ _____________________________________________________________________________________________________________________________________ °_____________________________________________________________________________________________________________________________________ °_____________________________________________________________________________________________________________________________________ ° °WHEN TO CALL YOUR DOCTOR: °1. Fever over 101.0 °2. Nausea and/or vomiting. °3. Extreme swelling or bruising. °4. Continued bleeding from incision. °5. Increased pain, redness, or drainage from the incision. ° °The clinic staff is available to answer your questions during regular business hours.  Please don’t hesitate to call and ask to speak to one of the nurses for clinical concerns.  If you have a medical emergency, go to the nearest  emergency room or call 911.  A surgeon from [facility] is always on call at the hospital. ° °For further questions, please visit [email or web address]  ° ° ° °  Post Anesthesia Home Care Instructions ° °Activity: °Get plenty of rest for the remainder of the day. A responsible adult should stay with you for 24 hours following the procedure.  °For the next 24 hours, DO NOT: °-Drive a car °-Operate machinery °-Drink alcoholic beverages °-Take any medication unless instructed by your physician °-Make any legal decisions or sign important papers. ° °Meals: °Start with liquid foods such as gelatin or soup. Progress to regular foods as tolerated. Avoid greasy, spicy, heavy foods. If nausea and/or vomiting occur, drink only clear liquids until the nausea and/or vomiting subsides. Call your physician if vomiting continues. ° °Special Instructions/Symptoms: °Your throat may feel dry or sore from the anesthesia or the breathing tube placed in your throat during surgery. If this causes discomfort, gargle with warm salt water. The discomfort should disappear within 24 hours. ° °If you had a scopolamine patch placed behind your ear for the management of post- operative nausea and/or vomiting: ° °1. The medication in the patch is effective for 72 hours, after which it should be removed.  Wrap patch in a tissue and discard in the trash. Wash hands thoroughly with soap and water. °2. You may remove the patch earlier than 72 hours if you experience unpleasant side effects which may include dry mouth, dizziness or visual disturbances. °3. Avoid touching the patch. Wash your hands with soap and water after contact with the patch. °  ° °

## 2015-12-26 NOTE — Interval H&P Note (Signed)
History and Physical Interval Note:  12/26/2015 1:14 PM  Brittany Walker  has presented today for surgery, with the diagnosis of Right fibroadenoma   The various methods of treatment have been discussed with the patient and family. After consideration of risks, benefits and other options for treatment, the patient has consented to  Procedure(s): RIGHT BREAST LUMPECTOMY (Right) as a surgical intervention .  The patient's history has been reviewed, patient examined, no change in status, stable for surgery.  I have reviewed the patient's chart and labs.  Questions were answered to the patient's satisfaction.     Laruth Hanger A.

## 2015-12-26 NOTE — H&P (Signed)
H&P   Brittany Walker (MR# [id number])      H&P Info    Author Note Status Last Update User Last Update Date/Time   Erroll Luna, MD Signed Erroll Luna, MD     H&P    Expand All Collapse All   Mikel Cella  Location: [facility] Patient #: [id number] DOB: [birth year] Married / Language: English / Race: Undefined Female  History of Present Illness  Patient words: Patient sent at the request of Dr. Enriqueta Shutter for right breast mass. The patient has had a right breast mass in the medial portion of her right breast last year. It is gotten larger. She has been followed by [facility] [place] with serial ultrasounds. 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 It had increased in size over 6 months by 4-5 mm. It does cause mild to moderate discomfort and the patient states it is getting larger when she does her breast self examinations. She desires excision at this point due to the increase in size.                          CLINICAL DATA: Six-month follow-up of a probably benign right breast mass. EXAM: ULTRASOUND OF THE RIGHT BREAST COMPARISON: Previous exam(s). FINDINGS: On physical exam, a firm mobile palpable mass can be identified at the 3 o'clock location. Targeted ultrasound is performed, showing a hypoechoic mass measuring 2.9 x 2.6 x 1.4 cm, previously measuring 2.5 x 2.5 x 1.4 cm. The deep margin of the mass appears circumscribed, however other margins appear indistinct. IMPRESSION: The palpable mass in the right breast at 3 o'clock has slightly grown in size in the last 6 months. RECOMMENDATION: Ultrasound-guided biopsy is recommended for the palpable right breast mass. This has been scheduled for 09/14/2015 at 2 p.m. I have discussed the findings and recommendations with the patient. Results were also provided in writing at the conclusion of the visit. If applicable, a reminder letter will be sent to the  patient regarding the next appointment. BI-RADS CATEGORY 4: Suspicious abnormality - biopsy should be considered. Electronically Signed By: Ammie Ferrier M.D. On: 09/11/2015 17:12           Diagnosis Breast, right, needle core biopsy, 3:00 o'clock - FIBROADENOMA. - NO ATYPIA OR MALIGNANCY.  The patient is a 30 year old female.   Other Problems  No pertinent past medical history  Past Surgical History  Breast Biopsy Right.  Diagnostic Studies History  Colonoscopy never Mammogram within last year  Allergies  No Known Drug Allergies 10/12/2015  Medication History [name], CMA; No Current Medications Medications Reconciled  Social History [name], [place]; Alcohol use Occasional alcohol use. No drug use Tobacco use Never smoker.  Family History [name], [place];  First Degree Relatives No pertinent family history  Pregnancy / Birth History  Age at menarche 89 years. Gravida 1 Maternal age 72-30 Para 0 Regular periods     Review of Systems  General Not Present- Appetite Loss, Chills, Fatigue, Fever, Night Sweats, Weight Gain and Weight Loss. Skin Not Present- Change in Wart/Mole, Dryness, Hives, Jaundice, New Lesions, Non-Healing Wounds, Rash and Ulcer. HEENT Not Present- Earache, Hearing Loss, Hoarseness, Nose Bleed, Oral Ulcers, Ringing in the Ears, Seasonal Allergies, Sinus Pain, Sore Throat, Visual Disturbances, Wears glasses/contact lenses and Yellow Eyes. Respiratory Not Present- Bloody sputum, Chronic Cough, Difficulty Breathing, Snoring and Wheezing. Breast Present- Breast Mass. Not Present- Breast Pain, Nipple Discharge and  Skin Changes. Cardiovascular Not Present- Chest Pain, Difficulty Breathing Lying Down, Leg Cramps, Palpitations, Rapid Heart Rate, Shortness of Breath and Swelling of Extremities. Gastrointestinal Not Present- Abdominal Pain, Bloating, Bloody Stool, Change in Bowel Habits, Chronic diarrhea,  Constipation, Difficulty Swallowing, Excessive gas, Gets full quickly at meals, Hemorrhoids, Indigestion, Nausea, Rectal Pain and Vomiting. Female Genitourinary Not Present- Frequency, Nocturia, Painful Urination, Pelvic Pain and Urgency. Neurological Not Present- Decreased Memory, Fainting, Headaches, Numbness, Seizures, Tingling, Tremor, Trouble walking and Weakness.  Vitals  10/12/2015 11:14 AM Weight: 120 lb Height: 63in Body Surface Area: 1.56 m Body Mass Index: 21.26 kg/m  Temp.: 97.32F(Temporal)  Pulse: 62 (Regular)  BP: 128/78 (Sitting, Left Arm, Standard)      Physical Exam   Head and Neck Head-normocephalic, atraumatic with no lesions or palpable masses. Trachea-midline. Thyroid Gland Characteristics - normal size and consistency.  Chest and Lung Exam Chest and lung exam reveals -quiet, even and easy respiratory effort with no use of accessory muscles and on auscultation, normal breath sounds, no adventitious sounds and normal vocal resonance. Inspection Chest Wall - Normal. Back - normal.  Breast Note: Right breast medial 3:00 mobile rubbery 3 cm mass corresponding T ultrasound-guided core biopsy site. No other masses in either breast.  Cardiovascular Cardiovascular examination reveals -normal heart sounds, regular rate and rhythm with no murmurs and normal pedal pulses bilaterally.  Musculoskeletal Normal Exam - Left-Upper Extremity Strength Normal and Lower Extremity Strength Normal. Normal Exam - Right-Upper Extremity Strength Normal and Lower Extremity Strength Normal.  Lymphatic Head & Neck  General Head & Neck Lymphatics: Bilateral - Description - Normal. Axillary  General Axillary Region: Bilateral - Description - Normal. Tenderness - Non Tender.    Assessment & Plan   BREAST MASS, RIGHT (N63) Impression: Recommend excision of right breast mass since it is increasing in size. She is interested in having more children  which will call us a fibroadenoma to grow rapidly. Location is in the medial right breast and if he gets much larger than it is now, removal all significant cosmetic deformity down the road. She desires excision of the right breast medial mass at this point. Given the size of the mass, she will not require see her wire localization since this is easily palpable Risk of lumpectomy include bleeding, infection, seroma, more surgery, use of seed/wire, wound care, cosmetic deformity and the need for other treatments, death , blood clots, death. Pt agrees to proceed.  Current Plans The anatomy and the physiology was discussed. The pathophysiology and natural history of the disease was discussed. Options were discussed and recommendations were made. Technique, risks, benefits, & alternatives were discussed. Risks such as stroke, heart attack, bleeding, indection, death, and other risks discussed. Questions answered. The patient agrees to proceed. You are being scheduled for surgery - Our schedulers will call you.  You should hear from our office's scheduling department within 5 working days about the location, date, and time of surgery. We try to make accommodations for patient's preferences in scheduling surgery, but sometimes the OR schedule or the surgeon's schedule prevents [place] from making those accommodations.  If you have not heard from our office [phone]) in 5 working days, call the office and ask for your surgeon's nurse.  If you have other questions about your diagnosis, plan, or surgery, call the office and ask for your surgeon's nurse.  Pt Education - [facility] Breast Biopsy HCI: discussed with patient and provided information.

## 2015-12-27 ENCOUNTER — Encounter (HOSPITAL_BASED_OUTPATIENT_CLINIC_OR_DEPARTMENT_OTHER): Payer: Self-pay | Admitting: Surgery

## 2015-12-27 NOTE — Op Note (Deleted)
[name], [name]              ACCOUNT NO.:  [id number]  MEDICAL RECORD NO.:  [id number]  LOCATION:                                FACILITY:  [facility]  PHYSICIAN:  Marcello Moores A. 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  PROCEDURE:  Right breast lumpectomy.  SURGEON:  Marcello Moores A. Arriyana Rodell, M.D.  ANESTHESIA:  LMA with 0.25% Sensorcaine local.  EBL:  Minimal.  SPECIMEN:  Right breast mass.  INDICATIONS FOR PROCEDURE:  The patient is a 30 year old female with a right breast mass.  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  It has been getting larger and she is interested in having children and is concerned about rapid growth during pregnancy. She was counseled in the office.  Her mammograms were reviewed and she was examined, which showed a 4-cm medial right breast consistent with fibroadenoma.  The remainder of her breast examination was normal.  She wished to have this removed.  Risk of bleeding, infection, the need for more surgery, other procedures to treat her condition, bleeding, seroma formation, cosmetic deformity, and anesthesia risks were discussed upfront.  She agreed to proceed.  This could also be followed as well.  DESCRIPTION OF PROCEDURE:  The patient was met in the holding area and questions were answered.  She was examined and the right medial breast mass was identified.  This was marked as the correct side.  She was taken back to the operating room and placed supine.  After induction of general anesthesia, the right breast was prepped and draped in sterile fashion.  Time-out was done to verify proper patient and procedure.   A mark was made over the mass.  Local anesthesia was infiltrated around the mass.  A transverse 3-cm incision was made.  The mass was excised in its entirety with gross negative margins was oriented and sent to Pathology.  The cavity was found to be hemostatic.  It was closed with 3-0 Vicryl and 4-0 Monocryl.  Liquid adhesive applied.  All final counts were found to be correct.  The patient was awoke, extubated and taken to recovery in satisfactory condition.     Brittany [name] A. Devante Capano, M.D.     TAC/MEDQ  D:  12/26/2015  T:  12/27/2015  Job:  [id number]

## 2015-12-27 NOTE — Op Note (Signed)
Brittany, [name]              ACCOUNT NO.:  [id number]  MEDICAL RECORD NO.:  [id number]  LOCATION:                                FACILITY:  [facility]  PHYSICIAN:  Marcello Moores A. 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  PROCEDURE:  Right breast lumpectomy.  SURGEON:  Marcello Moores A. Amanii Snethen, M.D.  ANESTHESIA:  LMA with 0.25% Sensorcaine local.  EBL:  Minimal.  SPECIMEN:  Right breast mass.  INDICATIONS FOR PROCEDURE:  The patient is a 30 year old female with a right breast mass.  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  It has been getting larger and she is interested in having children and is concerned about rapid growth during pregnancy. She was counseled in the office.  Her mammograms were reviewed and she was examined, which showed a 4-cm medial right breast consistent with fibroadenoma.  The remainder of her breast examination was normal.  She wished to have this removed.  Risk of bleeding, infection, the need for more surgery, other procedures to treat her condition, bleeding, seroma formation, cosmetic deformity, and anesthesia risks were discussed upfront.  She agreed to proceed.  This could also be followed as well.  DESCRIPTION OF PROCEDURE:  The patient was met in the holding area and questions were answered.  She was examined and the right medial breast mass was identified.  This was marked as the correct side.  She was taken back to the operating room and placed supine.  After induction of general anesthesia, the right breast was prepped and draped in sterile fashion.  Time-out was done to verify proper patient and procedure.   A mark was made over the mass.  Local anesthesia was infiltrated around the mass.  A transverse 3-cm incision was made.  The mass was excised in its entirety with gross negative margins was oriented and sent to Pathology.  The cavity was found to be hemostatic.  It was closed with 3-0 Vicryl and 4-0 Monocryl.  Liquid adhesive applied.  All final counts were found to be correct.  The patient was awoke, extubated and taken to recovery in satisfactory condition.     Ajia Chadderdon A. Shaina Gullatt, M.D.     TAC/MEDQ  D:  12/26/2015  T:  12/27/2015  Job:  [id number]

## 2016-01-29 ENCOUNTER — Ambulatory Visit (INDEPENDENT_AMBULATORY_CARE_PROVIDER_SITE_OTHER): Payer: Managed Care, Other (non HMO) | Admitting: Family Medicine

## 2016-01-29 ENCOUNTER — Encounter: Payer: Self-pay | Admitting: Family Medicine

## 2016-01-29 VITALS — BP 95/61 | HR 68 | Wt 122.0 lb

## 2016-01-29 DIAGNOSIS — Z0000 Encounter for general adult medical examination without abnormal findings: Secondary | ICD-10-CM | POA: Diagnosis not present

## 2016-01-29 LAB — LIPID PANEL
Cholesterol: 164 mg/dL (ref 125–200)
HDL: 66 mg/dL (ref >=46)
LDL Cholesterol: 84 mg/dL (ref <130)
Total CHOL/HDL Ratio: 2.5 Ratio (ref <=5.0)
Triglycerides: 70 mg/dL (ref <150)
VLDL: 14 mg/dL (ref <30)

## 2016-01-29 LAB — CBC WITH DIFFERENTIAL/PLATELET
BASOS ABS: 0 {cells}/uL (ref 0–200)
Basophils Relative: 0 %
EOS PCT: 2 %
Eosinophils Absolute: 114 cells/uL (ref 15–500)
HCT: 36.6 % (ref 35.0–45.0)
Hemoglobin: 11.9 g/dL (ref 11.7–15.5)
LYMPHS PCT: 46 %
Lymphs Abs: 2622 cells/uL (ref 850–3900)
MCH: 28.5 pg (ref 27.0–33.0)
MCHC: 32.5 g/dL (ref 32.0–36.0)
MCV: 87.6 fL (ref 80.0–100.0)
MONOS PCT: 7 %
MPV: 10.8 fL (ref 7.5–12.5)
Monocytes Absolute: 399 cells/uL (ref 200–950)
NEUTROS ABS: 2565 {cells}/uL (ref 1500–7800)
Neutrophils Relative %: 45 %
PLATELETS: 221 10*3/uL (ref 140–400)
RBC: 4.18 MIL/uL (ref 3.80–5.10)
RDW: 14.2 % (ref 11.0–15.0)
WBC: 5.7 10*3/uL (ref 3.8–10.8)

## 2016-01-29 LAB — COMPLETE METABOLIC PANEL WITH GFR
ALBUMIN: 4.4 g/dL (ref 3.6–5.1)
ALK PHOS: 41 U/L (ref 33–115)
ALT: 9 U/L (ref 6–29)
AST: 18 U/L (ref 10–30)
BILIRUBIN TOTAL: 0.5 mg/dL (ref 0.2–1.2)
BUN: 7 mg/dL (ref 7–25)
CO2: 25 mmol/L (ref 20–31)
CREATININE: 0.57 mg/dL (ref 0.50–1.10)
Calcium: 9.4 mg/dL (ref 8.6–10.2)
Chloride: 107 mmol/L (ref 98–110)
GLUCOSE: 88 mg/dL (ref 65–99)
Potassium: 4.5 mmol/L (ref 3.5–5.3)
SODIUM: 143 mmol/L (ref 135–146)
TOTAL PROTEIN: 7 g/dL (ref 6.1–8.1)

## 2016-01-29 LAB — TSH: TSH: 2.81 m[IU]/L

## 2016-01-29 NOTE — Patient Instructions (Signed)

## 2016-01-29 NOTE — Progress Notes (Signed)
  Subjective:     Brittany Walker is a 30 y.o. female and is here for a comprehensive physical exam. The patient reports no problems.    Social History   Social History  . Marital Status: Single    Spouse Name: N/A  . Number of Children: N/A  . Years of Education: college   Occupational History  . Accountant     [employer]    Social History Main Topics  . Smoking status: Never Smoker   . Smokeless tobacco: Never Used  . Alcohol Use: Yes     Comment: rarely  . Drug Use: No  . Sexual Activity: Not on file   Other Topics Concern  . Not on file   Social History Narrative   Lives with parents.  Single.     Health Maintenance  Topic Date Due  . HIV Screening  06/07/1999  . INFLUENZA VACCINE  03/18/2016  . PAP SMEAR  01/25/2018  . TETANUS/TDAP  08/18/2020    The following portions of the patient's history were reviewed and updated as appropriate: allergies, current medications, past family history, past medical history, past social history, past surgical history and problem list.  Review of Systems A comprehensive review of systems was negative.   Objective:    BP 95/61 mmHg  Pulse 68  Wt 122 lb (55.339 kg)  SpO2 100% General appearance: alert, cooperative and appears stated age Head: Normocephalic, without obvious abnormality, atraumatic Eyes:   Ears: conj clear, EOMIi, PEERLA Nose: Nares normal. Septum midline. Mucosa normal. No drainage or sinus tenderness. Throat: lips, mucosa, and tongue normal; teeth and gums normal Neck: no adenopathy, no carotid bruit, no JVD, supple, symmetrical, trachea midline and thyroid not enlarged, symmetric, no tenderness/mass/nodules Back: symmetric, no curvature. ROM normal. No CVA tenderness. Lungs: clear to auscultation bilaterally Breasts: incision on right breast healing well Heart: regular rate and rhythm, S1, S2 normal, no murmur, click, rub or gallop Abdomen: soft, non-tender; bowel sounds normal; no masses,   no organomegaly Extremities: extremities normal, atraumatic, no cyanosis or edema Pulses: 2+ and symmetric Skin: Skin color, texture, turgor normal. No rashes or lesions Lymph nodes: Cervical, supraclavicular, and axillary nodes normal. Neurologic: Alert and oriented X 3, normal strength and tone. Normal symmetric reflexes. Normal coordination and gait    Assessment:    Healthy female exam.     Plan:     See After Visit Summary for Counseling Recommendations  Keep up a regular exercise program and make sure you are eating a healthy diet Try to eat 4 servings of dairy a day, or if you are lactose intolerant take a calcium with vitamin D daily.  Your vaccines are up to date.  Encourage exercise.

## 2016-02-26 ENCOUNTER — Telehealth: Payer: Self-pay | Admitting: Family Medicine

## 2016-02-26 DIAGNOSIS — E611 Iron deficiency: Secondary | ICD-10-CM

## 2016-02-26 NOTE — Telephone Encounter (Signed)
Call patient: Please let her know that I did check we did not add an iron to her recent labs. Please tell her that I apologize. See if she would be willing to go back to the lab and we can run an iron, ferritin. She already had hemoglobin done.

## 2016-02-27 ENCOUNTER — Other Ambulatory Visit: Payer: Self-pay

## 2016-02-27 DIAGNOSIS — E611 Iron deficiency: Secondary | ICD-10-CM

## 2016-02-27 NOTE — Telephone Encounter (Signed)
Patient advised and labs released.

## 2016-02-27 NOTE — Telephone Encounter (Signed)
lvm w/recommendations. Asked that she rtn call if ok with doing lab. (labs ordered just need to be released and faxed if she is ok).Brittany Walker [place]

## 2016-03-05 LAB — IRON: IRON: 36 ug/dL — AB (ref 40–190)

## 2016-03-05 LAB — FERRITIN: Ferritin: 19 ng/mL (ref 10–154)

## 2016-09-11 ENCOUNTER — Other Ambulatory Visit: Payer: Self-pay | Admitting: *Deleted

## 2016-09-11 DIAGNOSIS — R790 Abnormal level of blood mineral: Secondary | ICD-10-CM

## 2016-09-11 LAB — IRON: IRON: 83 ug/dL (ref 40–190)

## 2016-09-12 LAB — FERRITIN: Ferritin: 46 ng/mL (ref 10–154)

## 2017-05-29 DIAGNOSIS — O24415 Gestational diabetes mellitus in pregnancy, controlled by oral hypoglycemic drugs: Secondary | ICD-10-CM | POA: Insufficient documentation

## 2017-06-03 ENCOUNTER — Encounter: Payer: Self-pay | Admitting: Family Medicine

## 2017-08-20 ENCOUNTER — Ambulatory Visit: Payer: Managed Care, Other (non HMO) | Admitting: Family Medicine

## 2017-08-20 ENCOUNTER — Encounter: Payer: Self-pay | Admitting: Family Medicine

## 2017-08-20 VITALS — BP 105/58 | HR 61

## 2017-08-20 DIAGNOSIS — Z13220 Encounter for screening for lipoid disorders: Secondary | ICD-10-CM | POA: Diagnosis not present

## 2017-08-20 DIAGNOSIS — R5383 Other fatigue: Secondary | ICD-10-CM | POA: Diagnosis not present

## 2017-08-20 DIAGNOSIS — D509 Iron deficiency anemia, unspecified: Secondary | ICD-10-CM | POA: Diagnosis not present

## 2017-08-20 DIAGNOSIS — D649 Anemia, unspecified: Secondary | ICD-10-CM | POA: Diagnosis not present

## 2017-08-20 DIAGNOSIS — Z8349 Family history of other endocrine, nutritional and metabolic diseases: Secondary | ICD-10-CM | POA: Diagnosis not present

## 2017-08-20 DIAGNOSIS — Z8632 Personal history of gestational diabetes: Secondary | ICD-10-CM | POA: Diagnosis not present

## 2017-08-20 NOTE — Progress Notes (Signed)
   Subjective:    Patient ID: Brittany Walker, female    DOB: August 14, 1986, 32 y.o.   MRN: [id number]  HPI 32 year old female is here today to follow-up on her anemia.  She had her first baby back in October and says her hemoglobin got down to 10.  She has been taking her regular iron supplementation and wants to see if it is back up or not.  She is can have to take it chronically.  I do suspect she is probably a poor absorber.    She is been doing well with the new baby.  She is breast-feeding and has been getting adequate sleep.  Baby sleeping for 5-6 hours at a time.  She has had some trouble losing the last bit of the baby weight even though she is nursing.  She is back at work and started an exercise routine yesterday.  She has been trying to eat healthy.  Her mom has B12 deficiency so would like to test her for that as well.  Review of Systems     Objective:   Physical Exam  Constitutional: She is oriented to person, place, and time. She appears well-developed and well-nourished.  HENT:  Head: Normocephalic and atraumatic.  Eyes: Conjunctivae are normal.  Neck: Neck supple.  Cardiovascular: Normal rate, regular rhythm and normal heart sounds.  Pulmonary/Chest: Effort normal and breath sounds normal.  Neurological: She is alert and oriented to person, place, and time.  Skin: Skin is warm and dry.  Psychiatric: She has a normal mood and affect. Her behavior is normal.        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  Family history of B12 deficiency-we will recheck her levels.  Cholesterol screening due. fam hx of high cholesterol as well.   History of gestational diabetes-we will check a hemoglobin A1c.  Difficulty losing weight post pregnancy-we will check a thyroid level.  Started an exercise regimen yesterday so hopefully this will help as well.  She really wants to get back down to 120 pounds which was her baseline weight  before trying to get pregnant.
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
## 2018-01-25 ENCOUNTER — Encounter: Payer: Self-pay | Admitting: Family Medicine

## 2018-01-25 ENCOUNTER — Ambulatory Visit (INDEPENDENT_AMBULATORY_CARE_PROVIDER_SITE_OTHER): Payer: 59 | Admitting: Family Medicine

## 2018-01-25 VITALS — BP 105/68 | HR 60 | Ht 63.0 in | Wt 129.0 lb

## 2018-01-25 DIAGNOSIS — Z0000 Encounter for general adult medical examination without abnormal findings: Secondary | ICD-10-CM | POA: Diagnosis not present

## 2018-01-25 NOTE — Progress Notes (Signed)
Subjective:     Brittany Walker is a 32 y.o. female and is here for a comprehensive physical exam. The patient reports no problems. She sees [facility].  She is had a daughter and October 2018.  She did breast-feed for the first 3 months.  Social History   Socioeconomic History  . Marital status: Single    Spouse name: Not on file  . Number of children: 1  . Years of education: college  . Highest education level: Not on file  Occupational History  . Occupation: Accountant    Comment: [employer].   Social Needs  . Financial resource strain: Not on file  . Food insecurity:    Worry: Not on file    Inability: Not on file  . Transportation needs:    Medical: Not on file    Non-medical: Not on file  Tobacco Use  . Smoking status: Never Smoker  . Smokeless tobacco: Never Used  Substance and Sexual Activity  . Alcohol use: Yes    Comment: rarely  . Drug use: No  . Sexual activity: Not on file  Lifestyle  . Physical activity:    Days per week: Not on file    Minutes per session: Not on file  . Stress: Not on file  Relationships  . Social connections:    Talks on phone: Not on file    Gets together: Not on file    Attends religious service: Not on file    Active member of club or organization: Not on file    Attends meetings of clubs or organizations: Not on file    Relationship status: Not on file  . Intimate partner violence:    Fear of current or ex partner: Not on file    Emotionally abused: Not on file    Physically abused: Not on file    Forced sexual activity: Not on file  Other Topics Concern  . Not on file  Social History Narrative    Married with one daughter.    Health Maintenance  Topic Date Due  . HIV Screening  06/07/1999  . PAP SMEAR  01/25/2018  . INFLUENZA VACCINE  03/18/2018  . TETANUS/TDAP  03/07/2027    The following portions of the patient's history were reviewed and updated as appropriate: allergies, current medications, past  family history, past medical history, past social history, past surgical history and problem list.  Review of Systems A comprehensive review of systems was negative.   Objective:    BP 105/68   Pulse 60   Ht 5\' 3"  (1.6 m)   Wt 129 lb (58.5 kg)   LMP 01/17/2018 (Exact Date)   SpO2 100%   BMI 22.85 kg/m  General appearance: alert, cooperative and appears stated age Head: Normocephalic, without obvious abnormality, atraumatic Eyes: conj clear, EOMI, PEERLA Ears: normal TM's and external ear canals both ears Nose: Nares normal. Septum midline. Mucosa normal. No drainage or sinus tenderness. Throat: lips, mucosa, and tongue normal; teeth and gums normal Neck: no adenopathy, no carotid bruit, no JVD, supple, symmetrical, trachea midline and thyroid not enlarged, symmetric, no tenderness/mass/nodules Back: symmetric, no curvature. ROM normal. No CVA tenderness. Lungs: clear to auscultation bilaterally Heart: regular rate and rhythm, S1, S2 normal, no murmur, click, rub or gallop Abdomen: soft, non-tender; bowel sounds normal; no masses,  no organomegaly Extremities: extremities normal, atraumatic, no cyanosis or edema Pulses: 2+ and symmetric Skin: Skin color, texture, turgor normal. No rashes or lesions Lymph nodes: Cervical,  supraclavicular, and axillary nodes normal. Neurologic: Alert and oriented X 3, normal strength and tone. Normal symmetric reflexes. Normal coordination and gait    Assessment:    Healthy female exam.      Plan:     See After Visit Summary for Counseling Recommendations   complete physical examination Keep up a regular exercise program and make sure you are eating a healthy diet Try to eat 4 servings of dairy a day, or if you are lactose intolerant take a calcium with vitamin D daily.  Your vaccines are up to date.  Lab slip provided.

## 2018-01-25 NOTE — Patient Instructions (Signed)

## 2018-01-26 LAB — TSH: TSH: 1.56 mIU/L

## 2018-01-26 LAB — CBC
HEMATOCRIT: 35.3 % (ref 35.0–45.0)
HEMOGLOBIN: 12 g/dL (ref 11.7–15.5)
MCH: 28.8 pg (ref 27.0–33.0)
MCHC: 34 g/dL (ref 32.0–36.0)
MCV: 84.7 fL (ref 80.0–100.0)
MPV: 10.9 fL (ref 7.5–12.5)
Platelets: 182 10*3/uL (ref 140–400)
RBC: 4.17 10*6/uL (ref 3.80–5.10)
RDW: 13.2 % (ref 11.0–15.0)
WBC: 4.7 10*3/uL (ref 3.8–10.8)

## 2018-01-26 LAB — COMPLETE METABOLIC PANEL WITH GFR
AG Ratio: 1.7 (calc) (ref 1.0–2.5)
ALBUMIN MSPROF: 4.3 g/dL (ref 3.6–5.1)
ALT: 9 U/L (ref 6–29)
AST: 16 U/L (ref 10–30)
Alkaline phosphatase (APISO): 57 U/L (ref 33–115)
BUN: 11 mg/dL (ref 7–25)
CO2: 27 mmol/L (ref 20–32)
CREATININE: 0.6 mg/dL (ref 0.50–1.10)
Calcium: 9.2 mg/dL (ref 8.6–10.2)
Chloride: 106 mmol/L (ref 98–110)
GFR, EST AFRICAN AMERICAN: 139 mL/min/{1.73_m2} (ref >=60)
GFR, Est Non African American: 120 mL/min/{1.73_m2} (ref >=60)
GLUCOSE: 84 mg/dL (ref 65–99)
Globulin: 2.6 g/dL (calc) (ref 1.9–3.7)
Potassium: 4.2 mmol/L (ref 3.5–5.3)
Sodium: 140 mmol/L (ref 135–146)
TOTAL PROTEIN: 6.9 g/dL (ref 6.1–8.1)
Total Bilirubin: 0.4 mg/dL (ref 0.2–1.2)

## 2018-01-26 LAB — LIPID PANEL
Cholesterol: 186 mg/dL (ref <200)
HDL: 61 mg/dL (ref >50)
LDL Cholesterol (Calc): 109 mg/dL (calc) — ABNORMAL HIGH
Non-HDL Cholesterol (Calc): 125 mg/dL (calc) (ref <130)
Total CHOL/HDL Ratio: 3 (calc) (ref <5.0)
Triglycerides: 73 mg/dL (ref <150)

## 2018-01-26 LAB — HEMOGLOBIN A1C
HEMOGLOBIN A1C: 5.1 %{Hb} (ref <5.7)
MEAN PLASMA GLUCOSE: 100 (calc)
eAG (mmol/L): 5.5 (calc)

## 2018-01-26 LAB — FERRITIN: FERRITIN: 33 ng/mL (ref 10–154)

## 2018-01-26 LAB — VITAMIN D 25 HYDROXY (VIT D DEFICIENCY, FRACTURES): Vit D, 25-Hydroxy: 28 ng/mL — ABNORMAL LOW (ref 30–100)

## 2018-01-26 LAB — VITAMIN B12: Vitamin B-12: 308 pg/mL (ref 200–1100)

## 2018-04-22 ENCOUNTER — Other Ambulatory Visit: Payer: Self-pay | Admitting: Family Medicine

## 2018-05-11 LAB — HM PAP SMEAR: HM Pap smear: NEGATIVE

## 2018-10-21 LAB — HIV ANTIBODY (ROUTINE TESTING W REFLEX): HIV 1&2 Ab, 4th Generation: NONREACTIVE

## 2019-01-31 ENCOUNTER — Telehealth: Payer: Self-pay | Admitting: Family Medicine

## 2019-01-31 DIAGNOSIS — D509 Iron deficiency anemia, unspecified: Secondary | ICD-10-CM

## 2019-01-31 DIAGNOSIS — R7989 Other specified abnormal findings of blood chemistry: Secondary | ICD-10-CM

## 2019-01-31 DIAGNOSIS — Z13220 Encounter for screening for lipoid disorders: Secondary | ICD-10-CM

## 2019-02-01 ENCOUNTER — Encounter: Payer: Self-pay | Admitting: Family Medicine

## 2019-02-01 ENCOUNTER — Other Ambulatory Visit: Payer: Self-pay | Admitting: *Deleted

## 2019-02-01 DIAGNOSIS — R7989 Other specified abnormal findings of blood chemistry: Secondary | ICD-10-CM

## 2019-02-01 LAB — CBC
HCT: 37 % (ref 35.0–45.0)
Hemoglobin: 12.4 g/dL (ref 11.7–15.5)
MCH: 29.5 pg (ref 27.0–33.0)
MCHC: 33.5 g/dL (ref 32.0–36.0)
MCV: 88.1 fL (ref 80.0–100.0)
MPV: 10.8 fL (ref 7.5–12.5)
Platelets: 200 10*3/uL (ref 140–400)
RBC: 4.2 10*6/uL (ref 3.80–5.10)
RDW: 13 % (ref 11.0–15.0)
WBC: 6.4 10*3/uL (ref 3.8–10.8)

## 2019-02-01 LAB — LIPID PANEL
Cholesterol: 200 mg/dL — ABNORMAL HIGH (ref <200)
HDL: 79 mg/dL (ref >=50)
LDL Cholesterol (Calc): 105 mg/dL (calc) — ABNORMAL HIGH
Non-HDL Cholesterol (Calc): 121 mg/dL (calc) (ref <130)
Total CHOL/HDL Ratio: 2.5 (calc) (ref <5.0)
Triglycerides: 69 mg/dL (ref <150)

## 2019-02-01 LAB — COMPLETE METABOLIC PANEL WITH GFR
AG Ratio: 1.7 (calc) (ref 1.0–2.5)
ALT: 57 U/L — ABNORMAL HIGH (ref 6–29)
AST: 39 U/L — ABNORMAL HIGH (ref 10–30)
Albumin: 4.3 g/dL (ref 3.6–5.1)
Alkaline phosphatase (APISO): 88 U/L (ref 31–125)
BUN: 14 mg/dL (ref 7–25)
CO2: 26 mmol/L (ref 20–32)
Calcium: 9.4 mg/dL (ref 8.6–10.2)
Chloride: 105 mmol/L (ref 98–110)
Creat: 0.68 mg/dL (ref 0.50–1.10)
GFR, Est African American: 132 mL/min/{1.73_m2} (ref >=60)
GFR, Est Non African American: 114 mL/min/{1.73_m2} (ref >=60)
Globulin: 2.5 g/dL (calc) (ref 1.9–3.7)
Glucose, Bld: 81 mg/dL (ref 65–99)
Potassium: 4.4 mmol/L (ref 3.5–5.3)
Sodium: 140 mmol/L (ref 135–146)
Total Bilirubin: 0.4 mg/dL (ref 0.2–1.2)
Total Protein: 6.8 g/dL (ref 6.1–8.1)

## 2019-02-01 LAB — VITAMIN D 25 HYDROXY (VIT D DEFICIENCY, FRACTURES): Vit D, 25-Hydroxy: 29 ng/mL — ABNORMAL LOW (ref 30–100)

## 2019-02-01 NOTE — Telephone Encounter (Signed)
She came in today with her mom and wanted to go ahead and get her lab work done today as well.  So ordered screening lipid, CMP and CBC as well as vitamin D as she has had previous low vitamin D levels.  She had her second baby a couple of months ago.  Little boy.

## 2019-03-15 ENCOUNTER — Encounter: Payer: Self-pay | Admitting: Family Medicine

## 2019-03-15 LAB — COMPLETE METABOLIC PANEL WITH GFR
AG Ratio: 1.6 (calc) (ref 1.0–2.5)
ALT: 23 U/L (ref 6–29)
AST: 22 U/L (ref 10–30)
Albumin: 4.2 g/dL (ref 3.6–5.1)
Alkaline phosphatase (APISO): 88 U/L (ref 31–125)
BUN: 14 mg/dL (ref 7–25)
CO2: 26 mmol/L (ref 20–32)
Calcium: 9.3 mg/dL (ref 8.6–10.2)
Chloride: 106 mmol/L (ref 98–110)
Creat: 0.68 mg/dL (ref 0.50–1.10)
GFR, Est African American: 132 mL/min/{1.73_m2} (ref >=60)
GFR, Est Non African American: 114 mL/min/{1.73_m2} (ref >=60)
Globulin: 2.7 g/dL (calc) (ref 1.9–3.7)
Glucose, Bld: 108 mg/dL — ABNORMAL HIGH (ref 65–99)
Potassium: 3.9 mmol/L (ref 3.5–5.3)
Sodium: 140 mmol/L (ref 135–146)
Total Bilirubin: 0.4 mg/dL (ref 0.2–1.2)
Total Protein: 6.9 g/dL (ref 6.1–8.1)

## 2019-05-13 ENCOUNTER — Telehealth: Payer: Self-pay | Admitting: Family Medicine

## 2019-05-13 NOTE — Telephone Encounter (Signed)
Please call pt and find out where goes to OB/GYN and get last pap report

## 2019-05-13 NOTE — Telephone Encounter (Signed)
She goes to [facility] will send request for records.Brittany Walker, [place]

## 2019-06-30 ENCOUNTER — Encounter: Payer: Self-pay | Admitting: Family Medicine

## 2019-10-23 ENCOUNTER — Ambulatory Visit: Payer: 59

## 2020-02-02 ENCOUNTER — Telehealth: Payer: Self-pay | Admitting: Family Medicine

## 2020-02-02 DIAGNOSIS — Z0000 Encounter for general adult medical examination without abnormal findings: Secondary | ICD-10-CM

## 2020-02-02 DIAGNOSIS — R7989 Other specified abnormal findings of blood chemistry: Secondary | ICD-10-CM

## 2020-02-02 NOTE — Telephone Encounter (Signed)
Patient calling in wanting labs placed before physical.

## 2020-02-03 NOTE — Telephone Encounter (Signed)
Labs ordered.

## 2020-02-03 NOTE — Telephone Encounter (Signed)
Patient advised.

## 2020-02-25 LAB — CBC
HCT: 35.9 % (ref 35.0–45.0)
Hemoglobin: 12 g/dL (ref 11.7–15.5)
MCH: 28.6 pg (ref 27.0–33.0)
MCHC: 33.4 g/dL (ref 32.0–36.0)
MCV: 85.7 fL (ref 80.0–100.0)
MPV: 11.2 fL (ref 7.5–12.5)
Platelets: 193 10*3/uL (ref 140–400)
RBC: 4.19 10*6/uL (ref 3.80–5.10)
RDW: 13.1 % (ref 11.0–15.0)
WBC: 4.7 10*3/uL (ref 3.8–10.8)

## 2020-02-25 LAB — LIPID PANEL
Cholesterol: 149 mg/dL (ref <200)
HDL: 55 mg/dL (ref >=50)
LDL Cholesterol (Calc): 80 mg/dL (calc)
Non-HDL Cholesterol (Calc): 94 mg/dL (calc) (ref <130)
Total CHOL/HDL Ratio: 2.7 (calc) (ref <5.0)
Triglycerides: 59 mg/dL (ref <150)

## 2020-02-25 LAB — COMPLETE METABOLIC PANEL WITH GFR
AG Ratio: 1.8 (calc) (ref 1.0–2.5)
ALT: 10 U/L (ref 6–29)
AST: 19 U/L (ref 10–30)
Albumin: 4.4 g/dL (ref 3.6–5.1)
Alkaline phosphatase (APISO): 51 U/L (ref 31–125)
BUN: 11 mg/dL (ref 7–25)
CO2: 26 mmol/L (ref 20–32)
Calcium: 9.1 mg/dL (ref 8.6–10.2)
Chloride: 109 mmol/L (ref 98–110)
Creat: 0.66 mg/dL (ref 0.50–1.10)
GFR, Est African American: 133 mL/min/{1.73_m2} (ref >=60)
GFR, Est Non African American: 114 mL/min/{1.73_m2} (ref >=60)
Globulin: 2.4 g/dL (calc) (ref 1.9–3.7)
Glucose, Bld: 93 mg/dL (ref 65–99)
Potassium: 5.1 mmol/L (ref 3.5–5.3)
Sodium: 140 mmol/L (ref 135–146)
Total Bilirubin: 0.6 mg/dL (ref 0.2–1.2)
Total Protein: 6.8 g/dL (ref 6.1–8.1)

## 2020-02-25 LAB — VITAMIN D 25 HYDROXY (VIT D DEFICIENCY, FRACTURES): Vit D, 25-Hydroxy: 26 ng/mL — ABNORMAL LOW (ref 30–100)

## 2020-03-01 ENCOUNTER — Encounter: Payer: Self-pay | Admitting: Family Medicine

## 2020-03-01 ENCOUNTER — Ambulatory Visit (INDEPENDENT_AMBULATORY_CARE_PROVIDER_SITE_OTHER): Payer: 59 | Admitting: Family Medicine

## 2020-03-01 VITALS — BP 108/62 | HR 66 | Ht 63.0 in | Wt 125.0 lb

## 2020-03-01 DIAGNOSIS — Z0000 Encounter for general adult medical examination without abnormal findings: Secondary | ICD-10-CM

## 2020-03-01 DIAGNOSIS — L720 Epidermal cyst: Secondary | ICD-10-CM | POA: Diagnosis not present

## 2020-03-01 DIAGNOSIS — E559 Vitamin D deficiency, unspecified: Secondary | ICD-10-CM | POA: Diagnosis not present

## 2020-03-01 NOTE — Patient Instructions (Signed)
Health Maintenance, Female Adopting a healthy lifestyle and getting preventive care are important in promoting health and wellness. Ask your health care provider about:  The right schedule for you to have regular tests and exams.  Things you can do on your own to prevent diseases and keep yourself healthy. What should I know about diet, weight, and exercise? Eat a healthy diet   Eat a diet that includes plenty of vegetables, fruits, low-fat dairy products, and lean protein.  Do not eat a lot of foods that are high in solid fats, added sugars, or sodium. Maintain a healthy weight Body mass index (BMI) is used to identify weight problems. It estimates body fat based on height and weight. Your health care provider can help determine your BMI and help you achieve or maintain a healthy weight. Get regular exercise Get regular exercise. This is one of the most important things you can do for your health. Most adults should:  Exercise for at least 150 minutes each week. The exercise should increase your heart rate and make you sweat (moderate-intensity exercise).  Do strengthening exercises at least twice a week. This is in addition to the moderate-intensity exercise.  Spend less time sitting. Even light physical activity can be beneficial. Watch cholesterol and blood lipids Have your blood tested for lipids and cholesterol at 36 years of age, then have this test every 5 years. Have your cholesterol levels checked more often if:  Your lipid or cholesterol levels are high.  You are older than 36 years of age.  You are at high risk for heart disease. What should I know about cancer screening? Depending on your health history and family history, you may need to have cancer screening at various ages. This may include screening for:  Breast cancer.  Cervical cancer.  Colorectal cancer.  Skin cancer.  Lung cancer. What should I know about heart disease, diabetes, and high blood  pressure? Blood pressure and heart disease  High blood pressure causes heart disease and increases the risk of stroke. This is more likely to develop in people who have high blood pressure readings, are of African descent, or are overweight.  Have your blood pressure checked: ? Every 3-5 years if you are 18-39 years of age. ? Every year if you are 40 years old or older. Diabetes Have regular diabetes screenings. This checks your fasting blood sugar level. Have the screening done:  Once every three years after age 40 if you are at a normal weight and have a low risk for diabetes.  More often and at a younger age if you are overweight or have a high risk for diabetes. What should I know about preventing infection? Hepatitis B If you have a higher risk for hepatitis B, you should be screened for this virus. Talk with your health care provider to find out if you are at risk for hepatitis B infection. Hepatitis C Testing is recommended for:  Everyone born from 1945 through 1965.  Anyone with known risk factors for hepatitis C. Sexually transmitted infections (STIs)  Get screened for STIs, including gonorrhea and chlamydia, if: ? You are sexually active and are younger than 36 years of age. ? You are older than 36 years of age and your health care provider tells you that you are at risk for this type of infection. ? Your sexual activity has changed since you were last screened, and you are at increased risk for chlamydia or gonorrhea. Ask your health care provider if   you are at risk.  Ask your health care provider about whether you are at high risk for HIV. Your health care provider may recommend a prescription medicine to help prevent HIV infection. If you choose to take medicine to prevent HIV, you should first get tested for HIV. You should then be tested every 3 months for as long as you are taking the medicine. Pregnancy  If you are about to stop having your period (premenopausal) and  you may become pregnant, seek counseling before you get pregnant.  Take 400 to 800 micrograms (mcg) of folic acid every day if you become pregnant.  Ask for birth control (contraception) if you want to prevent pregnancy. Osteoporosis and menopause Osteoporosis is a disease in which the bones lose minerals and strength with aging. This can result in bone fractures. If you are 65 years old or older, or if you are at risk for osteoporosis and fractures, ask your health care provider if you should:  Be screened for bone loss.  Take a calcium or vitamin D supplement to lower your risk of fractures.  Be given hormone replacement therapy (HRT) to treat symptoms of menopause. Follow these instructions at home: Lifestyle  Do not use any products that contain nicotine or tobacco, such as cigarettes, e-cigarettes, and chewing tobacco. If you need help quitting, ask your health care provider.  Do not use street drugs.  Do not share needles.  Ask your health care provider for help if you need support or information about quitting drugs. Alcohol use  Do not drink alcohol if: ? Your health care provider tells you not to drink. ? You are pregnant, may be pregnant, or are planning to become pregnant.  If you drink alcohol: ? Limit how much you use to 0-1 drink a day. ? Limit intake if you are breastfeeding.  Be aware of how much alcohol is in your drink. In the [place] one drink equals one 12 oz bottle of beer (355 mL), one 5 oz glass of wine (148 mL), or one 1 oz glass of hard liquor (44 mL). General instructions  Schedule regular health, dental, and eye exams.  Stay current with your vaccines.  Tell your health care provider if: ? You often feel depressed. ? You have ever been abused or do not feel safe at home. Summary  Adopting a healthy lifestyle and getting preventive care are important in promoting health and wellness.  Follow your health care provider's instructions about healthy  diet, exercising, and getting tested or screened for diseases.  Follow your health care provider's instructions on monitoring your cholesterol and blood pressure. This information is not intended to replace advice given to you by your health care provider. Make sure you discuss any questions you have with your health care provider. Document Revised: 07/28/2018 Document Reviewed: 07/28/2018 Elsevier Patient Education  2020 [employer]  

## 2020-03-01 NOTE — Progress Notes (Signed)
Subjective:     Brittany Walker is a 34 y.o. female and is here for a comprehensive physical exam. The patient reports no problems. She is doing Crossfit  3 days per week.  Labs have been done.   She has a skin lesion on her forehead she would like me to look at.  Has been there for months.  Seems to be coming to the surface.    Social History   Socioeconomic History  . Marital status: Single    Spouse name: Not on file  . Number of children: 1  . Years of education: college  . Highest education level: Not on file  Occupational History  . Occupation: Accountant    Comment: [employer].   Tobacco Use  . Smoking status: Never Smoker  . Smokeless tobacco: Never Used  Substance and Sexual Activity  . Alcohol use: Yes    Comment: rarely  . Drug use: No  . Sexual activity: Not on file  Other Topics Concern  . Not on file  Social History Narrative    Married with one daughter.    Social Determinants of Health   Financial Resource Strain:   . Difficulty of Paying Living Expenses:   Food Insecurity:   . Worried About Charity fundraiser in the Last Year:   . Arboriculturist in the Last Year:   Transportation Needs:   . Film/video editor (Medical):   Marland Kitchen Lack of Transportation (Non-Medical):   Physical Activity:   . Days of Exercise per Week:   . Minutes of Exercise per Session:   Stress:   . Feeling of Stress :   Social Connections:   . Frequency of Communication with Friends and Family:   . Frequency of Social Gatherings with Friends and Family:   . Attends Religious Services:   . Active Member of Clubs or Organizations:   . Attends Archivist Meetings:   Marland Kitchen Marital Status:   Intimate Partner Violence:   . Fear of Current or Ex-Partner:   . Emotionally Abused:   Marland Kitchen Physically Abused:   . Sexually Abused:    Health Maintenance  Topic Date Due  . Hepatitis C Screening  Never done  . HIV Screening  Never done  . INFLUENZA VACCINE  03/18/2020  .  PAP SMEAR-Modifier  05/11/2021  . TETANUS/TDAP  03/07/2027  . COVID-19 Vaccine  Completed    The following portions of the patient's history were reviewed and updated as appropriate: allergies, current medications, past family history, past medical history, past social history, past surgical history and problem list.  Review of Systems A comprehensive review of systems was negative.   Objective:    BP 108/62   Pulse 66   Ht 5\' 3"  (1.6 m)   Wt 125 lb (56.7 kg)   LMP 02/27/2020 (Exact Date)   SpO2 100%   BMI 22.14 kg/m  General appearance: alert, cooperative and appears stated age Head: Normocephalic, without obvious abnormality, atraumatic Eyes: conj clear, EOMi PEERLA Ears: normal TM's and external ear canals both ears Nose: Nares normal. Septum midline. Mucosa normal. No drainage or sinus tenderness. Throat: lips, mucosa, and tongue normal; teeth and gums normal Neck: no adenopathy, no carotid bruit, no JVD, supple, symmetrical, trachea midline and thyroid not enlarged, symmetric, no tenderness/mass/nodules Back: symmetric, no curvature. ROM normal. No CVA tenderness. Lungs: clear to auscultation bilaterally Breasts: normal appearance, no masses or tenderness Heart: regular rate and rhythm, S1, S2 normal, no  murmur, click, rub or gallop Abdomen: soft, non-tender; bowel sounds normal; no masses,  no organomegaly Extremities: extremities normal, atraumatic, no cyanosis or edema Pulses: 2+ and symmetric Skin: Skin color, texture, turgor normal. No rashes or lesions. She has some scatted milia on her face around ehr nose and the one of her forehead is likely an inflamed milia.  Lymph nodes: Cervical, supraclavicular, and axillary nodes normal. Neurologic: Alert and oriented X 3, normal strength and tone. Normal symmetric reflexes. Normal coordination and gait    Assessment:    Healthy female exam.      Plan:     See After Visit Summary for Counseling Recommendations   Keep  up a regular exercise program and make sure you are eating a healthy diet Try to eat 4 servings of dairy a day, or if you are lactose intolerant take a calcium with vitamin D daily.  Your vaccines are up to date.   Milia - gave reassurance   Vit D def - start OTC supplement.  Will recheck level in 3 mo

## 2020-04-13 ENCOUNTER — Encounter: Payer: Self-pay | Admitting: Family Medicine

## 2020-04-14 NOTE — Telephone Encounter (Signed)
Please abstract labs.  

## 2020-06-05 LAB — HM PAP SMEAR: HM Pap smear: NEGATIVE

## 2021-03-19 ENCOUNTER — Ambulatory Visit (INDEPENDENT_AMBULATORY_CARE_PROVIDER_SITE_OTHER): Payer: 59 | Admitting: Family Medicine

## 2021-03-19 ENCOUNTER — Encounter: Payer: Self-pay | Admitting: Family Medicine

## 2021-03-19 VITALS — BP 110/76 | HR 77 | Temp 99.0°F | Ht 63.0 in | Wt 122.0 lb

## 2021-03-19 DIAGNOSIS — Z0000 Encounter for general adult medical examination without abnormal findings: Secondary | ICD-10-CM

## 2021-03-19 NOTE — Progress Notes (Signed)
Subjective:     Brittany Walker is a 35 y.o. female and is here for a comprehensive physical exam. The patient reports no problems.  She follows with Dr. Owens Shark for her GYN care.  We will call and get copy of her most recent Pap smear HIV and hep C screening.  Social History   Socioeconomic History   Marital status: Single    Spouse name: Not on file   Number of children: 1   Years of education: college   Highest education level: Not on file  Occupational History   Occupation: Accountant    Comment: [employer].   Tobacco Use   Smoking status: Never   Smokeless tobacco: Never  Substance and Sexual Activity   Alcohol use: Yes    Comment: rarely   Drug use: No   Sexual activity: Not on file  Other Topics Concern   Not on file  Social History Narrative    Married with one daughter.    Social Determinants of Health   Financial Resource Strain: Not on file  Food Insecurity: Not on file  Transportation Needs: Not on file  Physical Activity: Not on file  Stress: Not on file  Social Connections: Not on file  Intimate Partner Violence: Not on file   Health Maintenance  Topic Date Due   HIV Screening  Never done   Hepatitis C Screening  Never done   COVID-19 Vaccine (2 - Booster for [employer] series) 12/13/2019   INFLUENZA VACCINE  03/18/2021   PAP SMEAR-Modifier  05/11/2021   TETANUS/TDAP  03/07/2027   Pneumococcal Vaccine 78-49 Years old  Aged Out   HPV VACCINES  Aged Out    The following portions of the patient's history were reviewed and updated as appropriate: allergies, current medications, past family history, past medical history, past social history, past surgical history, and problem list.  Review of Systems A comprehensive review of systems was negative.   Objective:    BP 110/76   Pulse 77   Temp 99 F (37.2 C) (Oral)   Ht '5\' 3"'$  (1.6 m)   Wt 122 lb (55.3 kg)   SpO2 100% Comment: on RA  BMI 21.61 kg/m  General appearance: alert, cooperative,  and appears stated age Head: Normocephalic, without obvious abnormality, atraumatic Eyes:  conj clear, EOMi, PEERLA Ears: normal TM's and external ear canals both ears Nose: Nares normal. Septum midline. Mucosa normal. No drainage or sinus tenderness. Throat: lips, mucosa, and tongue normal; teeth and gums normal Neck: no adenopathy, no carotid bruit, no JVD, supple, symmetrical, trachea midline, and thyroid not enlarged, symmetric, no tenderness/mass/nodules Back: symmetric, no curvature. ROM normal. No CVA tenderness. Lungs: clear to auscultation bilaterally Heart: regular rate and rhythm, S1, S2 normal, no murmur, click, rub or gallop Abdomen: soft, non-tender; bowel sounds normal; no masses,  no organomegaly Extremities: extremities normal, atraumatic, no cyanosis or edema Pulses: 2+ and symmetric Skin: Skin color, texture, turgor normal. No rashes or lesions Lymph nodes: Cervical adenopathy: nl'nl and Supraclavicular adenopathy: nl Neurologic: Grossly normal    Assessment:    Healthy female exam.      Plan:     See After Visit Summary for Counseling Recommendations  Keep up a regular exercise program and make sure you are eating a healthy diet Try to eat 4 servings of dairy a day, or if you are lactose intolerant take a calcium with vitamin D daily.  Your vaccines are up to date.   Orders Placed This Encounter  Procedures   Lipid Panel w/reflex Direct LDL   COMPLETE METABOLIC PANEL WITH GFR   CBC   HgB A1c   TSH

## 2021-03-20 LAB — COMPLETE METABOLIC PANEL WITH GFR
AG Ratio: 1.5 (calc) (ref 1.0–2.5)
ALT: 10 U/L (ref 6–29)
AST: 17 U/L (ref 10–30)
Albumin: 4.4 g/dL (ref 3.6–5.1)
Alkaline phosphatase (APISO): 50 U/L (ref 31–125)
BUN: 7 mg/dL (ref 7–25)
CO2: 27 mmol/L (ref 20–32)
Calcium: 9.1 mg/dL (ref 8.6–10.2)
Chloride: 105 mmol/L (ref 98–110)
Creat: 0.69 mg/dL (ref 0.50–0.97)
Globulin: 2.9 g/dL (calc) (ref 1.9–3.7)
Glucose, Bld: 80 mg/dL (ref 65–99)
Potassium: 4.7 mmol/L (ref 3.5–5.3)
Sodium: 139 mmol/L (ref 135–146)
Total Bilirubin: 0.7 mg/dL (ref 0.2–1.2)
Total Protein: 7.3 g/dL (ref 6.1–8.1)
eGFR: 115 mL/min/{1.73_m2} (ref >=60)

## 2021-03-20 LAB — HEMOGLOBIN A1C
Hgb A1c MFr Bld: 5.2 % of total Hgb (ref <5.7)
Mean Plasma Glucose: 103 mg/dL
eAG (mmol/L): 5.7 mmol/L

## 2021-03-20 LAB — LIPID PANEL W/REFLEX DIRECT LDL
Cholesterol: 149 mg/dL (ref <200)
HDL: 55 mg/dL (ref >=50)
LDL Cholesterol (Calc): 80 mg/dL (calc)
Non-HDL Cholesterol (Calc): 94 mg/dL (calc) (ref <130)
Total CHOL/HDL Ratio: 2.7 (calc) (ref <5.0)
Triglycerides: 64 mg/dL (ref <150)

## 2021-03-20 LAB — CBC
HCT: 37.6 % (ref 35.0–45.0)
Hemoglobin: 12.6 g/dL (ref 11.7–15.5)
MCH: 29.1 pg (ref 27.0–33.0)
MCHC: 33.5 g/dL (ref 32.0–36.0)
MCV: 86.8 fL (ref 80.0–100.0)
MPV: 11.4 fL (ref 7.5–12.5)
Platelets: 184 10*3/uL (ref 140–400)
RBC: 4.33 10*6/uL (ref 3.80–5.10)
RDW: 12.5 % (ref 11.0–15.0)
WBC: 6.3 10*3/uL (ref 3.8–10.8)

## 2021-03-20 LAB — TSH: TSH: 1.67 mIU/L

## 2021-03-20 NOTE — Progress Notes (Signed)
Your lab work is within acceptable range and there are no concerning findings.   ?

## 2021-12-16 ENCOUNTER — Telehealth: Payer: Self-pay | Admitting: Family Medicine

## 2021-12-16 DIAGNOSIS — Z0000 Encounter for general adult medical examination without abnormal findings: Secondary | ICD-10-CM

## 2021-12-16 NOTE — Telephone Encounter (Signed)
Pt called. She has scheduled her physical for 5/11. She would like lab order prepared for a week in advance. ?

## 2021-12-16 NOTE — Telephone Encounter (Signed)
Orders Placed This Encounter  ?Procedures  ? Lipid Panel w/reflex Direct LDL  ? COMPLETE METABOLIC PANEL WITH GFR  ? CBC  ? VITAMIN D 25 Hydroxy (Vit-D Deficiency, Fractures)  ? ? ?

## 2021-12-17 NOTE — Telephone Encounter (Signed)
Task completed. Patient informed annual order placed for fasting labs. Patient has confirmed to keep her upcoming appt with provider.  ?

## 2022-03-26 LAB — COMPLETE METABOLIC PANEL WITHOUT GFR
AG Ratio: 1.8 (calc) (ref 1.0–2.5)
ALT: 11 U/L (ref 6–29)
AST: 17 U/L (ref 10–30)
Albumin: 4.4 g/dL (ref 3.6–5.1)
Alkaline phosphatase (APISO): 43 U/L (ref 31–125)
BUN: 8 mg/dL (ref 7–25)
CO2: 29 mmol/L (ref 20–32)
Calcium: 9.2 mg/dL (ref 8.6–10.2)
Chloride: 106 mmol/L (ref 98–110)
Creat: 0.67 mg/dL (ref 0.50–0.97)
Globulin: 2.5 g/dL (ref 1.9–3.7)
Glucose, Bld: 88 mg/dL (ref 65–99)
Potassium: 4.8 mmol/L (ref 3.5–5.3)
Sodium: 140 mmol/L (ref 135–146)
Total Bilirubin: 0.5 mg/dL (ref 0.2–1.2)
Total Protein: 6.9 g/dL (ref 6.1–8.1)
eGFR: 115 mL/min/1.73m2

## 2022-03-26 LAB — LIPID PANEL W/REFLEX DIRECT LDL
Cholesterol: 165 mg/dL (ref <200)
HDL: 65 mg/dL (ref >=50)
LDL Cholesterol (Calc): 83 mg/dL (calc)
Non-HDL Cholesterol (Calc): 100 mg/dL (calc) (ref <130)
Total CHOL/HDL Ratio: 2.5 (calc) (ref <5.0)
Triglycerides: 76 mg/dL (ref <150)

## 2022-03-26 LAB — CBC
HCT: 32.8 % — ABNORMAL LOW (ref 35.0–45.0)
Hemoglobin: 10.9 g/dL — ABNORMAL LOW (ref 11.7–15.5)
MCH: 28.6 pg (ref 27.0–33.0)
MCHC: 33.2 g/dL (ref 32.0–36.0)
MCV: 86.1 fL (ref 80.0–100.0)
MPV: 11.7 fL (ref 7.5–12.5)
Platelets: 194 Thousand/uL (ref 140–400)
RBC: 3.81 Million/uL (ref 3.80–5.10)
RDW: 14.1 % (ref 11.0–15.0)
WBC: 5.2 Thousand/uL (ref 3.8–10.8)

## 2022-03-26 LAB — VITAMIN D 25 HYDROXY (VIT D DEFICIENCY, FRACTURES): Vit D, 25-Hydroxy: 25 ng/mL — ABNORMAL LOW (ref 30–100)

## 2022-03-26 NOTE — Progress Notes (Signed)
HI Kerry,  Some mild anemia and low vitamin D.  We can discuss at your appointment.  Otherwise labs look good.

## 2022-03-28 ENCOUNTER — Ambulatory Visit (INDEPENDENT_AMBULATORY_CARE_PROVIDER_SITE_OTHER): Payer: BC Managed Care – PPO | Admitting: Family Medicine

## 2022-03-28 ENCOUNTER — Encounter: Payer: Self-pay | Admitting: Family Medicine

## 2022-03-28 VITALS — BP 104/68 | HR 69 | Ht 63.0 in | Wt 131.0 lb

## 2022-03-28 DIAGNOSIS — E611 Iron deficiency: Secondary | ICD-10-CM | POA: Diagnosis not present

## 2022-03-28 DIAGNOSIS — E559 Vitamin D deficiency, unspecified: Secondary | ICD-10-CM

## 2022-03-28 DIAGNOSIS — Z0000 Encounter for general adult medical examination without abnormal findings: Secondary | ICD-10-CM

## 2022-03-28 DIAGNOSIS — D649 Anemia, unspecified: Secondary | ICD-10-CM

## 2022-03-28 MED ORDER — VITAMIN D (ERGOCALCIFEROL) 1.25 MG (50000 UNIT) PO CAPS: 50000.0000 [IU] | ORAL_CAPSULE | ORAL | 3 refills | Status: DC

## 2022-03-28 MED ORDER — FUSION PLUS PO CAPS: 1.0000 | ORAL_CAPSULE | Freq: Every day | ORAL | 3 refills | Status: DC

## 2022-03-28 NOTE — Progress Notes (Addendum)
Complete physical exam  Patient: Brittany Walker   DOB: 12-Feb-1986   36 y.o. Female  MRN: [id number]  Subjective:    Chief Complaint  Patient presents with   Annual Exam    Brittany Walker is a 36 y.o. female who presents today for a complete physical exam. She reports consuming a general diet.  Still working out regularly  She generally feels well. She reports sleeping well. She does not have additional problems to discuss today.   Labs were done August 8 did show vitamin D deficiency and some mild anemia otherwise normal.  Hemoglobin was 10.9 previously 12.6.  Vit D was 25.  Most recent fall risk assessment:    03/28/2022    2:13 PM  [place] in the past year? 0  Number falls in past yr: 0  Injury with Fall? 0  Risk for fall due to : No Fall Risks  Follow up Falls evaluation completed     Most recent depression screenings:    03/28/2022    2:13 PM 03/19/2021    9:52 AM  PHQ 2/9 Scores  PHQ - 2 Score 3 0  PHQ- 9 Score 5       Past Surgical History:  Procedure Laterality Date   BREAST LUMPECTOMY Right 12/26/2015   Procedure: RIGHT BREAST LUMPECTOMY;  Surgeon: Erroll Luna, MD;  Location: [facility];  Service: General;  Laterality: Right;   DILATION AND EVACUATION     Social History   Tobacco Use   Smoking status: Never   Smokeless tobacco: Never  Substance Use Topics   Alcohol use: Yes    Comment: rarely   Drug use: No   Family History  Problem Relation Age of Onset   Hypertension Mother    Hyperlipidemia Mother    Diabetes Mother    No Known Allergies    Patient Care Team: Hali Marry, MD as PCP - General (Family Medicine) Baird Kay, MD as Referring Physician (Obstetrics and Gynecology)   Outpatient Medications Prior to Visit  Medication Sig   [DISCONTINUED] Calcium Carb-Cholecalciferol (CALCIUM + VITAMIN D3 PO) Take 1 tablet by mouth daily. (Patient not taking: Reported on 03/28/2022)   [DISCONTINUED]  Multiple Vitamin (MULTIVITAMIN ADULT PO) Take 1 tablet by mouth daily. (Patient not taking: Reported on 03/28/2022)   No facility-administered medications prior to visit.    ROS        Objective:     BP 104/68 (BP Location: Right Arm, Patient Position: Sitting, Cuff Size: Normal)   Pulse 69   Ht '5\' 3"'$  (1.6 m)   Wt 131 lb (59.4 kg)   SpO2 99%   BMI 23.21 kg/m     Physical Exam Constitutional:      Appearance: Normal appearance. She is well-developed.  HENT:     Head: Normocephalic and atraumatic.     Right Ear: Tympanic membrane, ear canal and external ear normal.     Left Ear: Tympanic membrane, ear canal and external ear normal.     Nose: Nose normal.     Mouth/Throat:     Mouth: Mucous membranes are moist.     Pharynx: Oropharynx is clear. No posterior oropharyngeal erythema.  Eyes:     Conjunctiva/sclera: Conjunctivae normal.     Pupils: Pupils are equal, round, and reactive to light.  Neck:     Thyroid: No thyromegaly.  Cardiovascular:     Rate and Rhythm: Normal rate and regular rhythm.     Heart  sounds: Normal heart sounds.  Pulmonary:     Effort: Pulmonary effort is normal.     Breath sounds: Normal breath sounds. No wheezing.  Abdominal:     General: Bowel sounds are normal.     Palpations: Abdomen is soft.     Tenderness: There is no abdominal tenderness.  Musculoskeletal:        General: No swelling.     Cervical back: Neck supple.  Lymphadenopathy:     Cervical: No cervical adenopathy.  Skin:    General: Skin is warm and dry.  Neurological:     Mental Status: She is alert and oriented to person, place, and time.  Psychiatric:        Mood and Affect: Mood normal.      No results found for any visits on 03/28/22.     Assessment & Plan:    Routine Health Maintenance and Physical Exam  Immunization History  Administered Date(s) Administered   Influenza Inj Mdck Quad Pf 05/09/2019   Influenza-Unspecified 05/15/2017   Janssen (J&J)  SARS-COV-2 Vaccination 10/18/2019   Tdap 08/18/2010, 03/06/2017    Health Maintenance  Topic Date Due   COVID-19 Vaccine (2 - Booster for [employer] series) 08/18/2022 (Originally 12/13/2019)   INFLUENZA VACCINE  11/16/2022 (Originally 03/18/2022)   Hepatitis C Screening  03/29/2023 (Originally 06/06/2002)   PAP SMEAR-Modifier  06/06/2023   TETANUS/TDAP  03/07/2027   HIV Screening  Completed   Pneumococcal Vaccine 44-80 Years old  Aged Out   HPV VACCINES  Aged Out    Discussed health benefits of physical activity, and encouraged her to engage in regular exercise appropriate for her age and condition.  Problem List Items Addressed This Visit       Other   Vitamin D deficiency   Relevant Medications   Vitamin D, Ergocalciferol, (DRISDOL) 1.25 MG (50000 UNIT) CAPS capsule   Other Visit Diagnoses     Wellness examination    -  Primary   Anemia, unspecified type       Relevant Medications   Iron-FA-B Cmp-C-Biot-Probiotic (FUSION PLUS) CAPS   Iron deficiency       Relevant Medications   Iron-FA-B Cmp-C-Biot-Probiotic (FUSION PLUS) CAPS      Keep up a regular exercise program and make sure you are eating a healthy diet Try to eat 4 servings of dairy a day, or if you are lactose intolerant take a calcium with vitamin D daily.  Your vaccines are up to date.   Return in about 1 year (around 03/29/2023) for Wellness Exam.     Brittany Lecher, MD

## 2022-03-28 NOTE — Addendum Note (Signed)
Addended by: Beatrice Lecher D on: 03/28/2022 02:43 PM   Modules accepted: Orders

## 2022-05-29 ENCOUNTER — Telehealth: Payer: Self-pay | Admitting: Family Medicine

## 2022-05-29 ENCOUNTER — Ambulatory Visit (INDEPENDENT_AMBULATORY_CARE_PROVIDER_SITE_OTHER): Payer: BC Managed Care – PPO | Admitting: Family Medicine

## 2022-05-29 VITALS — BP 104/63 | HR 71 | Ht 63.0 in | Wt 131.0 lb

## 2022-05-29 DIAGNOSIS — K1370 Unspecified lesions of oral mucosa: Secondary | ICD-10-CM

## 2022-05-29 DIAGNOSIS — E611 Iron deficiency: Secondary | ICD-10-CM

## 2022-05-29 DIAGNOSIS — D509 Iron deficiency anemia, unspecified: Secondary | ICD-10-CM

## 2022-05-29 DIAGNOSIS — E559 Vitamin D deficiency, unspecified: Secondary | ICD-10-CM

## 2022-05-29 DIAGNOSIS — Z23 Encounter for immunization: Secondary | ICD-10-CM

## 2022-05-29 MED ORDER — VIRT-PN DHA 27-0.6-0.4-300 MG PO CAPS: ORAL_CAPSULE | ORAL | 3 refills | Status: DC

## 2022-05-29 NOTE — Assessment & Plan Note (Signed)
Continue with regular vitamin D supplementation we can always recheck levels again in the new year.

## 2022-05-29 NOTE — Telephone Encounter (Signed)
Brittany Walker can you check on the labs that were drawn for her physical.  She says they are covered every year but this past year she had to pay $400 and the insurance only paid like 16 can you check into it and see if maybe we coded something wrong or if we need to add some codes.  Thank you so much.

## 2022-05-29 NOTE — Assessment & Plan Note (Signed)
Iron levels will recheck at the beginning of the year it looks like there is a prenatal with extra iron that might be covered with her current insurance that might be a little less expensive.  We will try sending that over to the pharmacy to see if it is covered.  If not then she might benefit from Floradix which is a vegan iron and can be found on [address] for around $20.

## 2022-05-29 NOTE — Progress Notes (Signed)
Established Patient Office Visit  Subjective   Patient ID: Brittany Walker, female    DOB: January 28, 1986  Age: 36 y.o. MRN: [id number]  Chief Complaint  Patient presents with   iron follow up    Pt states iron tablets prescribed are too expensive . She is requesting a change of medication.    cyst inside mouth R side cheek     Pt c/o cyst in mouth R side cheek- states has seen dentist who recommended she see oral surgeon but she would like to see if Dr. Madilyn Fireman could remove it today. She states she keeps biting it and it keeps getting larger. - has been there for 3 to 4 months     HPI  F/U iron deficiency - Pt states iron tablets prescribed are too expensive .  They are over $120 a month.  They do work well and she tolerates them well and does not have any GI symptoms which has been great.  But she wants to know if there is an alternative.  She had previously taken a prenatal vitamin called CitraNatal that she did well with in the past and wonders if that might be cheaper.  She is requesting a change of medication.   Pt c/o lesion in mouth R side cheek- states has seen dentist who recommended she see oral surgeon but she would like to see if Dr. Madilyn Fireman could remove it today. She states she keeps biting it and it keeps getting larger. - has been there for 3 to 4 months it protrudes so much now that she accidentally bites it and it gets trauma.  Its been there for some time.     ROS    Objective:     BP 104/63   Pulse 71   Ht '5\' 3"'$  (1.6 m)   Wt 131 lb (59.4 kg)   SpO2 97%   BMI 23.21 kg/m    Physical Exam Vitals reviewed.  Constitutional:      Appearance: She is well-developed.  HENT:     Head: Normocephalic and atraumatic.     Mouth/Throat:     Comments: And on the right inner cheek is consistent with a regular mouth mucosa but is somewhat pendulous.  Measuring just shy of a centimeter. Eyes:     Conjunctiva/sclera: Conjunctivae normal.  Cardiovascular:     Rate and  Rhythm: Normal rate.  Pulmonary:     Effort: Pulmonary effort is normal.  Skin:    General: Skin is dry.     Coloration: Skin is not pale.  Neurological:     Mental Status: She is alert and oriented to person, place, and time.  Psychiatric:        Behavior: Behavior normal.      No results found for any visits on 05/29/22.    The ASCVD Risk score (Arnett DK, et al., 2019) failed to calculate for the following reasons:   The 2019 ASCVD risk score is only valid for ages 1 to 28    Assessment & Plan:   Problem List Items Addressed This Visit       Other   Vitamin D deficiency    Continue with regular vitamin D supplementation we can always recheck levels again in the new year.      Iron deficiency anemia    Iron levels will recheck at the beginning of the year it looks like there is a prenatal with extra iron that might be covered with her current insurance that  might be a little less expensive.  We will try sending that over to the pharmacy to see if it is covered.  If not then she might benefit from Floradix which is a vegan iron and can be found on [place] [place] for around $20.      Other Visit Diagnoses     Iron deficiency    -  Primary   Relevant Medications   Prenat w/o A-FE-Methfol-FA-DHA (VIRT-PN DHA) 27-0.6-0.4-300 MG CAPS   Oral lesion       Relevant Orders   Ambulatory referral to Oral Maxillofacial Surgery   Need for immunization against influenza       Relevant Orders   Flu Vaccine QUAD 62moIM (Fluarix, Fluzone & Alfiuria Quad PF) (Completed)      Oral lesion-we did discuss that it does need to be removed repeat trauma can cause cellular changes.  Recommend referral to oral surgery for excision.  No follow-ups on file.    CBeatrice Lecher MD

## 2022-05-30 NOTE — Telephone Encounter (Signed)
Added codes to the labs. Patient aware it may take up to 6 weeks to process through insurance.

## 2022-06-09 ENCOUNTER — Encounter: Payer: Self-pay | Admitting: Family Medicine

## 2022-06-19 ENCOUNTER — Ambulatory Visit (INDEPENDENT_AMBULATORY_CARE_PROVIDER_SITE_OTHER): Payer: BC Managed Care – PPO | Admitting: Family Medicine

## 2022-06-19 DIAGNOSIS — Z23 Encounter for immunization: Secondary | ICD-10-CM

## 2022-06-19 NOTE — Progress Notes (Signed)
Pt received COVID vaccine.

## 2022-06-23 ENCOUNTER — Encounter: Payer: Self-pay | Admitting: Family Medicine

## 2022-07-15 ENCOUNTER — Encounter: Payer: Self-pay | Admitting: Family Medicine

## 2022-08-22 NOTE — Telephone Encounter (Signed)
I sent an email to [facility] to inquire about the bill.  What I see at first glance is the diagnosis code used was Z23 which would have been for the flu shot, but not the office visit.  Once I hear back from [facility] billing, I will have a better understanding of why the insurance did not pay on the entire claim.

## 2022-08-25 NOTE — Telephone Encounter (Signed)
The information received from billing is showing on DOS: 05/29/22, the primary diagnosis used was E61.1 and it was put towards the patients deductible.    For Brittany Walker, the incorrect diagnosis code was used for his physical exam.  I sent a message to charge correction to have this re coded with Z00.00 and re filed to the insurance co.  They will need to allow 30 to 45 days for reprocessing.

## 2023-04-02 ENCOUNTER — Telehealth: Payer: Self-pay | Admitting: Family Medicine

## 2023-04-02 NOTE — Telephone Encounter (Signed)
Patient called in about [facility] bill she received from a visit on 03/25/22. Stated that the insurance will not pay the bill because it was coded incorrectly. Please Advise, thank you!

## 2023-05-08 NOTE — Telephone Encounter (Signed)
Can you chek angela with [facility] to see if we can submit additional codes?

## 2023-05-12 ENCOUNTER — Ambulatory Visit (INDEPENDENT_AMBULATORY_CARE_PROVIDER_SITE_OTHER): Payer: BC Managed Care – PPO | Admitting: Family Medicine

## 2023-05-12 VITALS — BP 104/71 | HR 59 | Ht 63.0 in | Wt 132.0 lb

## 2023-05-12 DIAGNOSIS — Z23 Encounter for immunization: Secondary | ICD-10-CM

## 2023-05-12 DIAGNOSIS — Z0000 Encounter for general adult medical examination without abnormal findings: Secondary | ICD-10-CM | POA: Diagnosis not present

## 2023-05-12 DIAGNOSIS — D649 Anemia, unspecified: Secondary | ICD-10-CM

## 2023-05-12 DIAGNOSIS — E559 Vitamin D deficiency, unspecified: Secondary | ICD-10-CM | POA: Diagnosis not present

## 2023-05-12 DIAGNOSIS — E611 Iron deficiency: Secondary | ICD-10-CM | POA: Diagnosis not present

## 2023-05-12 NOTE — Progress Notes (Signed)
Complete physical exam  Patient: Brittany Walker   DOB: November 13, 1985   37 y.o. Female  MRN: [id number]  Subjective:    Chief Complaint  Patient presents with   Annual Exam    Brittany Walker is a 37 y.o. female who presents today for a complete physical exam. She reports consuming a general diet.  Works out 3 days per week.   She generally feels well. She reports sleeping well. She does not have additional problems to discuss today.   Still having trouble with her bill getting paid for labs from last year.   Most recent fall risk assessment:    05/12/2023    2:13 PM  Fall Risk   Falls in the past year? 0  Number falls in past yr: 0  Injury with Fall? 0  Risk for fall due to : No Fall Risks  Follow up Falls evaluation completed     Most recent depression screenings:    05/12/2023    2:14 PM 05/29/2022   10:03 AM  PHQ 2/9 Scores  PHQ - 2 Score 0 0        Patient Care Team: Agapito Games, MD as PCP - General (Family Medicine) Webb Laws, MD as Referring Physician (Obstetrics and Gynecology)   Outpatient Medications Prior to Visit  Medication Sig   Prenat w/o A-FE-Methfol-FA-DHA (VIRT-PN DHA) 27-0.6-0.4-300 MG CAPS TAKE ONE CAPSULE AND TABLET BY MOUTH DAILY   Vitamin D, Ergocalciferol, (DRISDOL) 1.25 MG (50000 UNIT) CAPS capsule Take 1 capsule (50,000 Units total) by mouth every 7 (seven) days.   No facility-administered medications prior to visit.    ROS        Objective:     BP 104/71   Pulse (!) 59   Ht 5\' 3"  (1.6 m)   Wt 132 lb (59.9 kg)   SpO2 100%   BMI 23.38 kg/m    Physical Exam Constitutional:      Appearance: Normal appearance.  HENT:     Head: Normocephalic and atraumatic.     Right Ear: Tympanic membrane, ear canal and external ear normal.     Left Ear: Tympanic membrane, ear canal and external ear normal.     Nose: Nose normal.     Mouth/Throat:     Pharynx: Oropharynx is clear.  Eyes:     Extraocular Movements:  Extraocular movements intact.     Conjunctiva/sclera: Conjunctivae normal.     Pupils: Pupils are equal, round, and reactive to light.  Neck:     Thyroid: No thyromegaly.  Cardiovascular:     Rate and Rhythm: Normal rate and regular rhythm.  Pulmonary:     Effort: Pulmonary effort is normal.     Breath sounds: Normal breath sounds.  Abdominal:     General: Bowel sounds are normal.     Palpations: Abdomen is soft.     Tenderness: There is no abdominal tenderness.  Musculoskeletal:        General: No swelling.     Cervical back: Neck supple.  Skin:    General: Skin is warm and dry.  Neurological:     Mental Status: She is oriented to person, place, and time.  Psychiatric:        Mood and Affect: Mood normal.        Behavior: Behavior normal.      No results found for any visits on 05/12/23.     Assessment & Plan:    Routine Health Maintenance and Physical Exam  Immunization History  Administered Date(s) Administered   Influenza Inj Mdck Quad Pf 05/09/2019   Influenza, Seasonal, Injecte, Preservative Fre 05/12/2023   Influenza,inj,Quad PF,6+ Mos 05/29/2022   Influenza-Unspecified 05/15/2017   Janssen (J&J) SARS-COV-2 Vaccination 10/18/2019   Pfizer(Comirnaty)Fall Seasonal Vaccine 12 years and older 06/19/2022   Tdap 08/18/2010, 03/06/2017    Health Maintenance  Topic Date Due   Hepatitis C Screening  Never done   Cervical Cancer Screening (HPV/Pap Cotest)  06/06/2023   DTaP/Tdap/Td (3 - Td or Tdap) 03/07/2027   INFLUENZA VACCINE  Completed   COVID-19 Vaccine  Completed   HIV Screening  Completed   Pneumococcal Vaccine 29-71 Years old  Aged Out   HPV VACCINES  Aged Out    Discussed health benefits of physical activity, and encouraged her to engage in regular exercise appropriate for her age and condition.  Problem List Items Addressed This Visit       Other   Vitamin D deficiency   Relevant Orders   CMP14+EGFR   Lipid panel   CBC   VITAMIN D 25 Hydroxy  (Vit-D Deficiency, Fractures)   Other Visit Diagnoses     Wellness examination    -  Primary   Relevant Orders   CMP14+EGFR   Lipid panel   CBC   VITAMIN D 25 Hydroxy (Vit-D Deficiency, Fractures)   Iron deficiency       Relevant Orders   CMP14+EGFR   Lipid panel   CBC   VITAMIN D 25 Hydroxy (Vit-D Deficiency, Fractures)   Anemia, unspecified type       Relevant Orders   CMP14+EGFR   Lipid panel   CBC   VITAMIN D 25 Hydroxy (Vit-D Deficiency, Fractures)   Encounter for immunization       Relevant Orders   Flu vaccine trivalent PF, 6mos and older(Flulaval,Afluria,Fluarix,Fluzone) (Completed)      Keep up a regular exercise program and make sure you are eating a healthy diet Try to eat 4 servings of dairy a day, or if you are lactose intolerant take a calcium with vitamin D daily.  Your vaccines are up to date.   Pap is schedule in 2 weeks.    Return in about 1 year (around 05/11/2024) for Wellness Exam.     Brittany Gasser, MD

## 2023-05-27 NOTE — Telephone Encounter (Signed)
This request was already done. [facility] just didn't send the new codes to the insurance company. They are submitting the codes again.

## 2023-06-20 LAB — CMP14+EGFR
ALT: 11 [IU]/L (ref 0–32)
AST: 18 [IU]/L (ref 0–40)
Albumin: 4.7 g/dL (ref 3.9–4.9)
Alkaline Phosphatase: 48 [IU]/L (ref 44–121)
BUN/Creatinine Ratio: 11 (ref 9–23)
BUN: 7 mg/dL (ref 6–20)
Bilirubin Total: 0.5 mg/dL (ref 0.0–1.2)
CO2: 25 mmol/L (ref 20–29)
Calcium: 9.7 mg/dL (ref 8.7–10.2)
Chloride: 101 mmol/L (ref 96–106)
Creatinine, Ser: 0.62 mg/dL (ref 0.57–1.00)
Globulin, Total: 2.6 g/dL (ref 1.5–4.5)
Glucose: 77 mg/dL (ref 70–99)
Potassium: 4.6 mmol/L (ref 3.5–5.2)
Sodium: 141 mmol/L (ref 134–144)
Total Protein: 7.3 g/dL (ref 6.0–8.5)
eGFR: 116 mL/min/{1.73_m2} (ref >59)

## 2023-06-20 LAB — CBC
Hematocrit: 37.9 % (ref 34.0–46.6)
Hemoglobin: 12.3 g/dL (ref 11.1–15.9)
MCH: 29.1 pg (ref 26.6–33.0)
MCHC: 32.5 g/dL (ref 31.5–35.7)
MCV: 90 fL (ref 79–97)
Platelets: 238 10*3/uL (ref 150–450)
RBC: 4.23 x10E6/uL (ref 3.77–5.28)
RDW: 12.8 % (ref 11.7–15.4)
WBC: 7.7 10*3/uL (ref 3.4–10.8)

## 2023-06-20 LAB — LIPID PANEL
Chol/HDL Ratio: 2.7 {ratio} (ref 0.0–4.4)
Cholesterol, Total: 200 mg/dL — ABNORMAL HIGH (ref 100–199)
HDL: 74 mg/dL (ref >39)
LDL Chol Calc (NIH): 113 mg/dL — ABNORMAL HIGH (ref 0–99)
Triglycerides: 72 mg/dL (ref 0–149)
VLDL Cholesterol Cal: 13 mg/dL (ref 5–40)

## 2023-06-20 LAB — VITAMIN D 25 HYDROXY (VIT D DEFICIENCY, FRACTURES): Vit D, 25-Hydroxy: 32.4 ng/mL (ref 30.0–100.0)

## 2023-06-23 NOTE — Progress Notes (Signed)
Hi Ritha, total cholesterol and LDL are up just slightly.  Triglycerides look great and your good cholesterol looks really good.  Metabolic panel is normal.  Blood counts normal no sign of anemia.  Your vitamin D looks much better it is the first time you are actually in the normal range in the last 5 years which is great!  So please continue with your vitamin D supplement.

## 2023-07-01 ENCOUNTER — Telehealth: Payer: Self-pay | Admitting: Family Medicine

## 2023-07-01 NOTE — Telephone Encounter (Signed)
Call fpr pap results from [facility]

## 2023-07-07 LAB — HM PAP SMEAR

## 2023-07-08 NOTE — Telephone Encounter (Signed)
Medical records request sent.

## 2024-05-12 ENCOUNTER — Ambulatory Visit (INDEPENDENT_AMBULATORY_CARE_PROVIDER_SITE_OTHER): Admitting: Family Medicine

## 2024-05-12 VITALS — BP 121/74 | HR 64 | Ht 63.0 in | Wt 133.1 lb

## 2024-05-12 DIAGNOSIS — L720 Epidermal cyst: Secondary | ICD-10-CM

## 2024-05-12 DIAGNOSIS — Z0000 Encounter for general adult medical examination without abnormal findings: Secondary | ICD-10-CM | POA: Diagnosis not present

## 2024-05-12 DIAGNOSIS — L858 Other specified epidermal thickening: Secondary | ICD-10-CM | POA: Diagnosis not present

## 2024-05-12 DIAGNOSIS — D229 Melanocytic nevi, unspecified: Secondary | ICD-10-CM

## 2024-05-12 MED ORDER — TRETINOIN 0.05 % EX CREA: TOPICAL_CREAM | CUTANEOUS | 1 refills | Status: AC

## 2024-05-12 NOTE — Progress Notes (Signed)
 Complete physical exam  Patient: Brittany Walker   DOB: 1985/08/27   38 y.o. Female  MRN: [id number]  Subjective:    Chief Complaint  Patient presents with   Annual Exam    Pt would like for you to look at some places on her face around her nose that she is concerned about and she also has a mole on her abdomen that has gotten larger after her pregnancies.    Brittany Walker is a 38 y.o. female who presents today for a complete physical exam. She reports consuming a general diet. The patient does not participate in regular exercise at present. She generally feels well. She reports sleeping well. She does not have additional problems to discuss today.    Most recent fall risk assessment:    05/12/2024    8:40 AM  Fall Risk   Falls in the past year? 0  Number falls in past yr: 0  Injury with Fall? 0  Risk for fall due to : No Fall Risks  Follow up Falls evaluation completed     Most recent depression screenings:    05/12/2024    8:40 AM 05/12/2023    2:14 PM  PHQ 2/9 Scores  PHQ - 2 Score 0 0         Patient Care Team: Alvan Dorothyann BIRCH, MD as PCP - General (Family Medicine) Aletta Ester, MD as Referring Physician (Obstetrics and Gynecology)   Outpatient Medications Prior to Visit  Medication Sig   ascorbic acid (VITAMIN C) 500 MG tablet Take 500 mg by mouth daily.   Multiple Vitamins-Minerals (WOMENS MULTIVITAMIN PO) Take 1 Capful by mouth daily.   [DISCONTINUED] Prenat w/o A-FE-Methfol-FA-DHA (VIRT-PN DHA) 27-0.6-0.4-300 MG CAPS TAKE ONE CAPSULE AND TABLET BY MOUTH DAILY   [DISCONTINUED] Vitamin D , Ergocalciferol , (DRISDOL ) 1.25 MG (50000 UNIT) CAPS capsule Take 1 capsule (50,000 Units total) by mouth every 7 (seven) days.   No facility-administered medications prior to visit.    ROS        Objective:     BP 121/74   Pulse 64   Ht 5' 3 (1.6 m)   Wt 133 lb 1.3 oz (60.4 kg)   SpO2 100%   BMI 23.57 kg/m     Physical Exam Constitutional:       Appearance: Normal appearance.  HENT:     Head: Normocephalic and atraumatic.     Right Ear: Tympanic membrane, ear canal and external ear normal.     Left Ear: Tympanic membrane, ear canal and external ear normal.     Nose: Nose normal.     Mouth/Throat:     Pharynx: Oropharynx is clear.  Eyes:     Extraocular Movements: Extraocular movements intact.     Conjunctiva/sclera: Conjunctivae normal.     Pupils: Pupils are equal, round, and reactive to light.  Neck:     Thyroid: No thyromegaly.  Cardiovascular:     Rate and Rhythm: Normal rate and regular rhythm.  Pulmonary:     Effort: Pulmonary effort is normal.     Breath sounds: Normal breath sounds.  Abdominal:     General: Bowel sounds are normal.     Palpations: Abdomen is soft.     Tenderness: There is no abdominal tenderness.  Musculoskeletal:        General: No swelling.     Cervical back: Neck supple.  Skin:    General: Skin is warm and dry.  Neurological:     Mental Status: She  is oriented to person, place, and time.  Psychiatric:        Mood and Affect: Mood normal.        Behavior: Behavior normal.      No results found for any visits on 05/12/24.      Assessment & Plan:    Routine Health Maintenance and Physical Exam  Immunization History  Administered Date(s) Administered   Influenza Inj Mdck Quad Pf 05/09/2019   Influenza, Seasonal, Injecte, Preservative Fre 05/12/2023   Influenza,inj,Quad PF,6+ Mos 05/29/2022   Influenza-Unspecified 05/15/2017   Janssen (J&J) SARS-COV-2 Vaccination 10/18/2019   Pfizer(Comirnaty)Fall Seasonal Vaccine 12 years and older 06/19/2022   Tdap 08/18/2010, 03/06/2017    Health Maintenance  Topic Date Due   Hepatitis C Screening  Never done   Hepatitis B Vaccines 19-59 Average Risk (1 of 3 - 19+ 3-dose series) Never done   HPV VACCINES (1 - 3-dose SCDM series) Never done   COVID-19 Vaccine (3 - 2025-26 season) 04/18/2024   Influenza Vaccine  11/15/2024 (Originally  03/18/2024)   Cervical Cancer Screening (HPV/Pap Cotest)  07/06/2026   DTaP/Tdap/Td (3 - Td or Tdap) 03/07/2027   HIV Screening  Completed   Pneumococcal Vaccine  Aged Out   Meningococcal B Vaccine  Aged Out    Discussed health benefits of physical activity, and encouraged her to engage in regular exercise appropriate for her age and condition.  Problem List Items Addressed This Visit       Musculoskeletal and Integument   Milia   Relevant Medications   tretinoin  (RETIN-A ) 0.05 % cream   Keratosis pilaris   Other Visit Diagnoses       Wellness examination    -  Primary   Relevant Orders   Vitamin D  (25 hydroxy)   CBC   Lipid Panel With LDL/HDL Ratio   CMP14+EGFR     Change in mole           Keep up a regular exercise program and make sure you are eating a healthy diet Try to eat 4 servings of dairy a day, or if you are lactose intolerant take a calcium with vitamin D  daily.  Your vaccines are up to date.   Return in about 1 year (around 05/12/2025) for Wellness Exam.     Dorothyann Byars, MD

## 2024-05-12 NOTE — Patient Instructions (Addendum)
 Topical treatments for keratosis pilaris include keratolytic creams containing ingredients like lactic acid, salicylic acid, and urea to help remove dead skin, and topical retinoids (vitamin A derivatives like tretinoin ) to prevent plugged hair follicles by promoting cell turnover.

## 2024-05-13 ENCOUNTER — Ambulatory Visit: Payer: Self-pay | Admitting: Family Medicine

## 2024-05-13 LAB — CBC
Hematocrit: 37 % (ref 34.0–46.6)
Hemoglobin: 11.9 g/dL (ref 11.1–15.9)
MCH: 28.6 pg (ref 26.6–33.0)
MCHC: 32.2 g/dL (ref 31.5–35.7)
MCV: 89 fL (ref 79–97)
Platelets: 220 x10E3/uL (ref 150–450)
RBC: 4.16 x10E6/uL (ref 3.77–5.28)
RDW: 12.9 % (ref 11.7–15.4)
WBC: 4.5 x10E3/uL (ref 3.4–10.8)

## 2024-05-13 LAB — LIPID PANEL WITH LDL/HDL RATIO
Cholesterol, Total: 187 mg/dL (ref 100–199)
HDL: 67 mg/dL (ref >39)
LDL Chol Calc (NIH): 111 mg/dL — ABNORMAL HIGH (ref 0–99)
LDL/HDL Ratio: 1.7 ratio (ref 0.0–3.2)
Triglycerides: 46 mg/dL (ref 0–149)
VLDL Cholesterol Cal: 9 mg/dL (ref 5–40)

## 2024-05-13 LAB — VITAMIN D 25 HYDROXY (VIT D DEFICIENCY, FRACTURES): Vit D, 25-Hydroxy: 29.6 ng/mL — ABNORMAL LOW (ref 30.0–100.0)

## 2024-05-13 LAB — CMP14+EGFR
ALT: 10 IU/L (ref 0–32)
AST: 24 IU/L (ref 0–40)
Albumin: 4.5 g/dL (ref 3.9–4.9)
Alkaline Phosphatase: 48 IU/L (ref 41–116)
BUN/Creatinine Ratio: 14 (ref 9–23)
BUN: 10 mg/dL (ref 6–20)
Bilirubin Total: 0.3 mg/dL (ref 0.0–1.2)
CO2: 21 mmol/L (ref 20–29)
Calcium: 9.6 mg/dL (ref 8.7–10.2)
Chloride: 103 mmol/L (ref 96–106)
Creatinine, Ser: 0.73 mg/dL (ref 0.57–1.00)
Globulin, Total: 2.5 g/dL (ref 1.5–4.5)
Glucose: 87 mg/dL (ref 70–99)
Potassium: 5.3 mmol/L — ABNORMAL HIGH (ref 3.5–5.2)
Sodium: 140 mmol/L (ref 134–144)
Total Protein: 7 g/dL (ref 6.0–8.5)
eGFR: 107 mL/min/1.73 (ref >59)

## 2024-05-13 NOTE — Progress Notes (Signed)
 Hi Brittany Walker,  Your vitamin D  has dropped again and it was up to 32 last time it is back down to 29 so please keep taking the extra vitamin D  I am not sure what strength you are on.  I would recommend taking at least 50 mcg daily.  LDL cholesterol is up just a little bit but triglycerides look good.  Continue to work on healthy diet and regular exercise.  For some reason the potassium was just slightly elevated it was probably just some hemolysis of the blood.  Liver function was normal.  Blood count look great.

## 2024-07-04 LAB — HM MAMMOGRAPHY

## 2024-07-06 LAB — HM PAP SMEAR
